# Patient Record
Sex: Female | Born: 1995 | Race: Black or African American | Hispanic: No | Marital: Single | Smoking: Never smoker
Health system: Southern US, Community
[De-identification: ages and names within clinical notes are randomized; demographics above are authoritative.]

## PROBLEM LIST (undated history)

## (undated) ENCOUNTER — Ambulatory Visit (HOSPITAL_COMMUNITY): Admission: EM | Discharge status: Absent without leave | Payer: Medicaid - Out of State

## (undated) DIAGNOSIS — F32A Depression, unspecified: Secondary | ICD-10-CM

## (undated) DIAGNOSIS — F329 Major depressive disorder, single episode, unspecified: Secondary | ICD-10-CM

## (undated) DIAGNOSIS — M069 Rheumatoid arthritis, unspecified: Secondary | ICD-10-CM

## (undated) DIAGNOSIS — K5090 Crohn's disease, unspecified, without complications: Secondary | ICD-10-CM

## (undated) DIAGNOSIS — F419 Anxiety disorder, unspecified: Secondary | ICD-10-CM

## (undated) HISTORY — DX: Major depressive disorder, single episode, unspecified: F32.9

## (undated) HISTORY — DX: Depression, unspecified: F32.A

## (undated) HISTORY — DX: Crohn's disease, unspecified, without complications: K50.90

## (undated) HISTORY — PX: CHOLECYSTECTOMY: SHX55

## (undated) HISTORY — DX: Anxiety disorder, unspecified: F41.9

## (undated) HISTORY — DX: Rheumatoid arthritis, unspecified: M06.9

---
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
## 2017-04-27 ENCOUNTER — Ambulatory Visit (HOSPITAL_COMMUNITY)
Admission: EM | Admit: 2017-04-27 | Discharge: 2017-04-27 | Disposition: A | Discharge status: Home / Self Care | Payer: Self-pay | Attending: Physician Assistant | Admitting: Physician Assistant

## 2017-04-27 ENCOUNTER — Encounter (HOSPITAL_COMMUNITY): Payer: Self-pay | Admitting: Family Medicine

## 2017-04-27 DIAGNOSIS — J0140 Acute pansinusitis, unspecified: Secondary | ICD-10-CM

## 2017-04-27 MED ORDER — METOCLOPRAMIDE HCL 5 MG/ML IJ SOLN
INTRAMUSCULAR | Status: AC
  Filled 2017-04-27: qty 2

## 2017-04-27 MED ORDER — METOCLOPRAMIDE HCL 5 MG/ML IJ SOLN
5.0000 mg | Freq: Once | INTRAMUSCULAR | Status: AC
  Administered 2017-04-27: 5 mg via INTRAMUSCULAR

## 2017-04-27 MED ORDER — DEXAMETHASONE SODIUM PHOSPHATE 10 MG/ML IJ SOLN
INTRAMUSCULAR | Status: AC
  Filled 2017-04-27: qty 1

## 2017-04-27 MED ORDER — BENZONATATE 100 MG PO CAPS: 100.0000 mg | ORAL_CAPSULE | Freq: Three times a day (TID) | ORAL | 0 refills | Status: DC

## 2017-04-27 MED ORDER — DEXAMETHASONE SODIUM PHOSPHATE 10 MG/ML IJ SOLN
10.0000 mg | Freq: Once | INTRAMUSCULAR | Status: AC
  Administered 2017-04-27: 10 mg via INTRAMUSCULAR

## 2017-04-27 MED ORDER — DOXYCYCLINE HYCLATE 100 MG PO CAPS: 100.0000 mg | ORAL_CAPSULE | Freq: Two times a day (BID) | ORAL | 0 refills | Status: DC

## 2017-04-27 MED ORDER — KETOROLAC TROMETHAMINE 60 MG/2ML IM SOLN
INTRAMUSCULAR | Status: AC
  Filled 2017-04-27: qty 2

## 2017-04-27 MED ORDER — FLUTICASONE PROPIONATE 50 MCG/ACT NA SUSP: 2.0000 | Freq: Every day | NASAL | 0 refills | Status: DC

## 2017-04-27 MED ORDER — IPRATROPIUM BROMIDE 0.06 % NA SOLN: 2.0000 | Freq: Four times a day (QID) | NASAL | 0 refills | Status: DC

## 2017-04-27 MED ORDER — KETOROLAC TROMETHAMINE 60 MG/2ML IM SOLN
60.0000 mg | Freq: Once | INTRAMUSCULAR | Status: AC
  Administered 2017-04-27: 60 mg via INTRAMUSCULAR

## 2017-04-27 NOTE — ED Notes (Signed)
Patient called mother during discussion of medications.  Patient is tearful, agitated.  Patient speaking to mother, this nurse spoke to mother about medicines.  Delay in administreation

## 2017-04-27 NOTE — ED Provider Notes (Signed)
[facility]    CSN: [id number] Arrival date & time: 04/27/17  1843     History   Chief Complaint Chief Complaint  Patient presents with  . Nasal Congestion  . Headache  . Cough    HPI Toni Gilbert is a 22 y.o. female.   22 year old female comes in for 2-week history of URI symptoms.  Has a productive cough, nasal congestion, rhinorrhea, headache, sore throat.  T-max 100.1.  Has had nausea without vomiting.  Denies diarrhea.  Has been taking OTC cold medication without relief.  Never smoker.     History reviewed. No pertinent past medical history.  There are no active problems to display for this patient.   History reviewed. No pertinent surgical history.  OB History   None      Home Medications    Prior to Admission medications   Medication Sig Start Date End Date Taking? Authorizing Provider  benzonatate (TESSALON) 100 MG capsule Take 1 capsule (100 mg total) by mouth every 8 (eight) hours. 04/27/17   Tasia Catchings, Delvina Mizzell V, PA-C  doxycycline (VIBRAMYCIN) 100 MG capsule Take 1 capsule (100 mg total) by mouth 2 (two) times daily. 04/27/17   Tasia Catchings, Kynnedi Zweig V, PA-C  fluticasone (FLONASE) 50 MCG/ACT nasal spray Place 2 sprays into both nostrils daily. 04/27/17   Tasia Catchings, Kayci Belleville V, PA-C  ipratropium (ATROVENT) 0.06 % nasal spray Place 2 sprays into both nostrils 4 (four) times daily. 04/27/17   Ok Edwards, PA-C    Family History History reviewed. No pertinent family history.  Social History Social History   Tobacco Use  . Smoking status: Never Smoker  Substance Use Topics  . Alcohol use: Not on file  . Drug use: Not on file     Allergies   Augmentin [amoxicillin-pot clavulanate]; Azithromycin; and Peanut-containing drug products   Review of Systems Review of Systems  Reason unable to perform ROS: See HPI as above.     Physical Exam Triage Vital Signs ED Triage Vitals  Enc Vitals Group     BP 04/27/17 2012 (!) 126/57     Pulse Rate 04/27/17 2012 91     Resp  04/27/17 2012 18     Temp 04/27/17 2012 98.3 F (36.8 C)     Temp src --      SpO2 04/27/17 2012 100 %     Weight --      Height --      Head Circumference --      Peak Flow --      Pain Score 04/27/17 2009 10     Pain Loc --      Pain Edu? --      Excl. in [place]? --    No data found.  Updated Vital Signs BP (!) 126/57   Pulse 91   Temp 98.3 F (36.8 C)   Resp 18   LMP 04/23/2017   SpO2 100%   Physical Exam  Constitutional: She is oriented to person, place, and time. She appears well-developed and well-nourished.  Non-toxic appearance. She does not appear ill. No distress.  Patient tearful on exam, states "everything hurts"  HENT:  Head: Normocephalic and atraumatic.  Right Ear: Tympanic membrane, external ear and ear canal normal. Tympanic membrane is not erythematous and not bulging.  Left Ear: Tympanic membrane, external ear and ear canal normal. Tympanic membrane is not erythematous and not bulging.  Nose: Mucosal edema present. Right sinus exhibits maxillary sinus tenderness and frontal sinus tenderness. Left sinus  exhibits maxillary sinus tenderness and frontal sinus tenderness.  Mouth/Throat: Uvula is midline, oropharynx is clear and moist and mucous membranes are normal.  Eyes: Pupils are equal, round, and reactive to light. EOM and lids are normal. Right conjunctiva is injected. Left conjunctiva is injected.  Conjunctival injection, patient tearful on exam.   Neck: Normal range of motion. Neck supple.  Cardiovascular: Normal rate, regular rhythm and normal heart sounds. Exam reveals no gallop and no friction rub.  No murmur heard. Pulmonary/Chest: Effort normal and breath sounds normal. She has no decreased breath sounds. She has no wheezes. She has no rhonchi. She has no rales.  Lymphadenopathy:    She has no cervical adenopathy.  Neurological: She is alert and oriented to person, place, and time.  Skin: Skin is warm and dry.  Psychiatric: She has a normal mood and  affect. Her behavior is normal. Judgment normal.     UC Treatments / Results  Labs (all labs ordered are listed, but only abnormal results are displayed) Labs Reviewed - No data to display  EKG None Radiology No results found.  Procedures Procedures (including critical care time)  Medications Ordered in UC Medications  ketorolac (TORADOL) injection 60 mg (has no administration in time range)  metoCLOPramide (REGLAN) injection 5 mg (has no administration in time range)  dexamethasone (DECADRON) injection 10 mg (has no administration in time range)     Initial Impression / Assessment and Plan / UC Course  I have reviewed the triage vital signs and the nursing notes.  Pertinent labs & imaging results that were available during my care of the patient were reviewed by me and considered in my medical decision making (see chart for details).    Doxycycline for sinusitis.  Given patient tearful, states pain "everywhere", with 2-week history of headache, Toradol, Reglan, Decadron injection in office today to help with the symptoms.  Other symptomatic treatment discussed.  Push fluids.  Return precautions given.  Final Clinical Impressions(s) / UC Diagnoses   Final diagnoses:  Acute non-recurrent pansinusitis    ED Discharge Orders        Ordered    doxycycline (VIBRAMYCIN) 100 MG capsule  2 times daily     04/27/17 2038    fluticasone (FLONASE) 50 MCG/ACT nasal spray  Daily     04/27/17 2038    ipratropium (ATROVENT) 0.06 % nasal spray  4 times daily     04/27/17 2038    benzonatate (TESSALON) 100 MG capsule  Every 8 hours     04/27/17 2038        Ok Edwards, PA-C 04/27/17 2042

## 2017-04-27 NOTE — ED Triage Notes (Signed)
Pt here for cough, congestion, headache and sore throat  X 1 week and half. Taking tylenol, advil and mucinex DM for relief.

## 2017-04-27 NOTE — Discharge Instructions (Signed)
Toradol, Reglan, Decadron injection in office today to help with your headache and body ache.  Start doxycycline as directed for sinus infection.  Tessalon for cough. Start flonase, atrovent nasal spray for nasal congestion/drainage. You can use over the counter nasal saline rinse such as neti pot for nasal congestion. Keep hydrated, your urine should be clear to pale yellow in color. Tylenol/motrin for fever and pain. Monitor for any worsening of symptoms, chest pain, shortness of breath, wheezing, swelling of the throat, follow up for reevaluation.   For sore throat try using a honey-based tea. Use 3 teaspoons of honey with juice squeezed from half lemon. Place shaved pieces of ginger into 1/2-1 cup of water and warm over stove top. Then mix the ingredients and repeat every 4 hours as needed.

## 2017-05-01 ENCOUNTER — Emergency Department (HOSPITAL_BASED_OUTPATIENT_CLINIC_OR_DEPARTMENT_OTHER)
Admission: EM | Admit: 2017-05-01 | Discharge: 2017-05-01 | Disposition: A | Discharge status: Home / Self Care | Payer: Medicaid - Out of State | Attending: Emergency Medicine | Admitting: Emergency Medicine

## 2017-05-01 ENCOUNTER — Other Ambulatory Visit: Payer: Self-pay

## 2017-05-01 ENCOUNTER — Encounter (HOSPITAL_BASED_OUTPATIENT_CLINIC_OR_DEPARTMENT_OTHER): Payer: Self-pay | Admitting: *Deleted

## 2017-05-01 DIAGNOSIS — R519 Headache, unspecified: Secondary | ICD-10-CM

## 2017-05-01 DIAGNOSIS — Z91010 Allergy to peanuts: Secondary | ICD-10-CM | POA: Diagnosis not present

## 2017-05-01 DIAGNOSIS — R51 Headache: Secondary | ICD-10-CM | POA: Insufficient documentation

## 2017-05-01 MED ORDER — NAPROXEN 500 MG PO TABS: 500.0000 mg | ORAL_TABLET | Freq: Two times a day (BID) | ORAL | 0 refills | Status: DC

## 2017-05-01 MED ORDER — KETOROLAC TROMETHAMINE 15 MG/ML IJ SOLN
15.0000 mg | Freq: Once | INTRAMUSCULAR | Status: AC
  Administered 2017-05-01: 15 mg via INTRAVENOUS
  Filled 2017-05-01: qty 1

## 2017-05-01 MED ORDER — CETIRIZINE HCL 10 MG PO TABS: 10.0000 mg | ORAL_TABLET | Freq: Every day | ORAL | 0 refills | Status: DC

## 2017-05-01 MED ORDER — METHOCARBAMOL 500 MG PO TABS: 500.0000 mg | ORAL_TABLET | Freq: Three times a day (TID) | ORAL | 0 refills | Status: DC | PRN

## 2017-05-01 MED ORDER — SODIUM CHLORIDE 0.9 % IV BOLUS
1000.0000 mL | Freq: Once | INTRAVENOUS | Status: AC
  Administered 2017-05-01: 1000 mL via INTRAVENOUS

## 2017-05-01 MED ORDER — PROCHLORPERAZINE EDISYLATE 5 MG/ML IJ SOLN
10.0000 mg | Freq: Once | INTRAMUSCULAR | Status: AC
  Administered 2017-05-01: 10 mg via INTRAVENOUS
  Filled 2017-05-01: qty 2

## 2017-05-01 MED ORDER — SODIUM CHLORIDE 0.9 % IV SOLN
INTRAVENOUS | Status: DC
  Administered 2017-05-01: 125 mL/h via INTRAVENOUS

## 2017-05-01 MED ORDER — DEXAMETHASONE SODIUM PHOSPHATE 10 MG/ML IJ SOLN
10.0000 mg | Freq: Once | INTRAMUSCULAR | Status: AC
  Administered 2017-05-01: 10 mg via INTRAVENOUS
  Filled 2017-05-01: qty 1

## 2017-05-01 NOTE — ED Triage Notes (Signed)
Cough, earache, body aches, headache for a week. She was started on antibiotics for a sinus infection a week ago with no improvement of symptoms.

## 2017-05-01 NOTE — ED Provider Notes (Signed)
[facility]PART[facility] Provider Note   CSN: [id number] Arrival date & time: 05/01/17  1849     History   Chief Complaint Chief Complaint  Patient presents with  . URI    HPI Toni Gilbert is a 22 y.o. female who presents to the ED with complaint of worsened headache and persistent URI sxs today. Patient states she has not felt well for about 2 weeks now. States she has been having congestion, rhinorrhea, sinus pressure, ear pain, sore throat, generalized body aches, chills, nausea, and headaches. Reports subjective fevers with temp max of 100.1.  She was seen at urgent care 03/31 (5 days prior) and was diagnosed with sinusitis and started on Doxycycline with additional prescriptions including Flonase, Atrovent nasal spray, and Tessalon. States she has been taking her medications as prescribed without significant improvement. States that today her headache gradually worsened. The headache is located to diffuse head and into the diffuse neck. Rates it a 10/10 in severity. States she has had associated intermittent double vision and feeling off balance- she states this is the same as previous migraines that have improved with migraine cocktails. Denies dizziness, numbness, weakness, chest pain, or dyspnea.  LMP ended 5 days prior, patient is not currently sexually active.    HPI  History reviewed. No pertinent past medical history.  There are no active problems to display for this patient.   History reviewed. No pertinent surgical history.   OB History   None      Home Medications    Prior to Admission medications   Medication Sig Start Date End Date Taking? Authorizing Provider  benzonatate (TESSALON) 100 MG capsule Take 1 capsule (100 mg total) by mouth every 8 (eight) hours. 04/27/17   Tasia Catchings, Amy V, PA-C  doxycycline (VIBRAMYCIN) 100 MG capsule Take 1 capsule (100 mg total) by mouth 2 (two) times daily. 04/27/17   Tasia Catchings, Amy V, PA-C  fluticasone (FLONASE) 50  MCG/ACT nasal spray Place 2 sprays into both nostrils daily. 04/27/17   Tasia Catchings, Amy V, PA-C  ipratropium (ATROVENT) 0.06 % nasal spray Place 2 sprays into both nostrils 4 (four) times daily. 04/27/17   Ok Edwards, PA-C    Family History History reviewed. No pertinent family history.  Social History Social History   Tobacco Use  . Smoking status: Never Smoker  . Smokeless tobacco: Never Used  Substance Use Topics  . Alcohol use: Never    Frequency: Never  . Drug use: Never     Allergies   Augmentin [amoxicillin-pot clavulanate]; Azithromycin; and Peanut-containing drug products   Review of Systems Review of Systems  Constitutional: Positive for chills.  HENT: Positive for congestion, ear pain, sinus pressure, sinus pain and sore throat.   Eyes: Positive for visual disturbance (intermittent double vision).  Respiratory: Positive for cough. Negative for shortness of breath.   Cardiovascular: Negative for chest pain, palpitations and leg swelling.  Gastrointestinal: Positive for nausea. Negative for abdominal pain, diarrhea and vomiting.  Genitourinary: Negative for vaginal bleeding and vaginal discharge.  Neurological: Positive for headaches. Negative for dizziness, syncope, weakness and numbness.  All other systems reviewed and are negative.    Physical Exam Updated Vital Signs Ht 5' 3"  (1.6 m)   Wt 55.3 kg (122 lb)   LMP 04/23/2017   BMI 21.61 kg/m   Physical Exam  Constitutional: She appears well-developed and well-nourished.  Non-toxic appearance. No distress.  HENT:  Head: Normocephalic and atraumatic.  Right Ear: No mastoid tenderness. Tympanic membrane  is not perforated, not erythematous, not retracted and not bulging.  Left Ear: No mastoid tenderness. Tympanic membrane is not perforated, not erythematous, not retracted and not bulging.  Nose: Mucosal edema and rhinorrhea present. Right sinus exhibits maxillary sinus tenderness and frontal sinus tenderness. Left sinus  exhibits maxillary sinus tenderness and frontal sinus tenderness.  Mouth/Throat: Uvula is midline and oropharynx is clear and moist. No oropharyngeal exudate or posterior oropharyngeal erythema.  Eyes: Pupils are equal, round, and reactive to light. Conjunctivae and EOM are normal. Right eye exhibits no discharge. Left eye exhibits no discharge.  Neck: Normal range of motion. Neck supple. Spinous process tenderness (diffuse, non focal) and muscular tenderness (bilateral) present. No neck rigidity.  Cardiovascular: Normal rate and regular rhythm.  No murmur heard. Pulmonary/Chest: Effort normal and breath sounds normal. No respiratory distress. She has no wheezes. She has no rhonchi. She has no rales.  Abdominal: Soft. She exhibits no distension. There is no tenderness.  Musculoskeletal:  Back: No midline tenderness.   Lymphadenopathy:    She has cervical adenopathy (mild anterior).  Neurological: She is alert.  Alert. Clear speech. No facial droop. CNIII-XII are intact. Bilateral upper and lower extremities' sensation grossly intact. 5/5 grip strength bilaterally. 5/5 plantar and dorsi flexion bilaterally. Normal finger to nose bilaterally. Negative pronator drift. Gait steady.   Skin: Skin is warm and dry. No rash noted.  Psychiatric: She has a normal mood and affect. Her behavior is normal.  Nursing note and vitals reviewed.   ED Treatments / Results  Labs (all labs ordered are listed, but only abnormal results are displayed) Labs Reviewed - No data to display  EKG None  Radiology No results found.  Procedures Procedures (including critical care time)  Medications Ordered in ED Medications  sodium chloride 0.9 % bolus 1,000 mL (0 mLs Intravenous Stopped 05/01/17 2108)    And  0.9 %  sodium chloride infusion ( Intravenous Stopped 05/01/17 2108)  ketorolac (TORADOL) 15 MG/ML injection 15 mg (15 mg Intravenous Given 05/01/17 1929)  prochlorperazine (COMPAZINE) injection 10 mg (10 mg  Intravenous Given 05/01/17 1929)  dexamethasone (DECADRON) injection 10 mg (10 mg Intravenous Given 05/01/17 1929)     Initial Impression / Assessment and Plan / ED Course  I have reviewed the triage vital signs and the nursing notes.  Pertinent labs & imaging results that were available during my care of the patient were reviewed by me and considered in my medical decision making (see chart for details).   Patient presents with persistent URI sxs and worsened headache today. Patient is nontoxic appearing, in no apparent distress, vitals without significant abnormality. Regarding URI sxs- patient currently on doxycyline for tx of sinusitis, on day 5 of abx, do not feel the need to change abx for this. Cent[facility] Criteria 1 for anterior cervical lymphadenopathy, doubt strep pharyngitis. Lungs are CTA, doubt pneumonia, additionally patient on doxycycline with good coverage for a possible pneumonia which is reassuring. No wheezing on lung exam. In regards to patient's headache- she has a hx of similar headaches, gradual onset with steady progression in severity- non concerning for [facility], ICH, ischemic CVA, acute glaucoma, giant cell arteritis, mass, or meningitis. Patient treated with toradol, compazine, decadron, and fluids in the ED with improvement. Will DC home with prescription for naproxen, robaxin, and zyrtec, instructed continuation of urgent care tx regimen. Instructed no driving/operating heavy machinery when taking robaxin. I discussed treatment plan, need for PCP follow-up, and return precautions with the patient. Provided opportunity  for questions, patient confirmed understanding and is in agreement with plan.   Findings and plan of care discussed with supervising physician Dr. Maryan Rued who is in agreement with plan.    Final Clinical Impressions(s) / ED Diagnoses   Final diagnoses:  Acute nonintractable headache, unspecified headache type    ED Discharge Orders        Ordered    naproxen  (NAPROSYN) 500 MG tablet  2 times daily     05/01/17 2122    cetirizine (ZYRTEC ALLERGY) 10 MG tablet  Daily     05/01/17 2122    methocarbamol (ROBAXIN) 500 MG tablet  Every 8 hours PRN     05/01/17 2122       Amaryllis Dyke, PA-C 05/02/17 0005    Blanchie Dessert, MD 05/03/17 [phone]

## 2017-05-01 NOTE — Discharge Instructions (Signed)
You were seen in the emergency department today for your headache and continued upper respiratory type symptoms. We have given you three prescriptions today:  - Naproxen is a nonsteroidal anti-inflammatory medication that will help with pain and swelling. Be sure to take this medication as prescribed with food, 1 pill every 12 hours,  It should be taken with food, as it can cause stomach upset, and more seriously, stomach bleeding. Do not take other nonsteroidal anti-inflammatory medications with this such as Advil, Motrin, or Aleve.  - Robaxin is the muscle relaxer I have prescribed, this is meant to help with muscle tightness. Be aware that this medication may make you drowsy therefore the first time you take this it should be at a time you are in an environment where you can rest. Do not drive or operate heavy machinery when taking this medication. This should help with the tension in your neck muscles - Zyrtec- this is an allergy medication, take it daily.   We have prescribed you new medication(s) today. Discuss the medications prescribed today with your pharmacist as they can have adverse effects and interactions with your other medicines including over the counter and prescribed medications. Seek medical evaluation if you start to experience new or abnormal symptoms after taking one of these medicines, seek care immediately if you start to experience difficulty breathing, feeling of your throat closing, facial swelling, or rash as these could be indications of a more serious allergic reaction   Continue to use the medications prescribed at urgent care. Complete the course of the antibiotic, doxycycline you were prescribed.   Follow up with your primary care doctor in 1 week for re-evaluation. If you do not have a primary care doctor call the phone number provided in your discharge instructions that is circled to set up primary care. Return to the ER for any new or worsening symptoms or any other  concerns.

## 2017-11-27 ENCOUNTER — Ambulatory Visit (INDEPENDENT_AMBULATORY_CARE_PROVIDER_SITE_OTHER): Payer: Self-pay | Admitting: Internal Medicine

## 2017-11-27 ENCOUNTER — Encounter: Payer: Self-pay | Admitting: Internal Medicine

## 2017-11-27 VITALS — BP 102/62 | HR 113 | Temp 98.4°F | Ht 63.0 in | Wt 132.4 lb

## 2017-11-27 DIAGNOSIS — M069 Rheumatoid arthritis, unspecified: Secondary | ICD-10-CM

## 2017-11-27 DIAGNOSIS — F419 Anxiety disorder, unspecified: Secondary | ICD-10-CM

## 2017-11-27 DIAGNOSIS — M25571 Pain in right ankle and joints of right foot: Secondary | ICD-10-CM

## 2017-11-27 DIAGNOSIS — F329 Major depressive disorder, single episode, unspecified: Secondary | ICD-10-CM

## 2017-11-27 DIAGNOSIS — F32A Depression, unspecified: Secondary | ICD-10-CM

## 2017-11-27 DIAGNOSIS — J45909 Unspecified asthma, uncomplicated: Secondary | ICD-10-CM | POA: Insufficient documentation

## 2017-11-27 DIAGNOSIS — M25572 Pain in left ankle and joints of left foot: Secondary | ICD-10-CM

## 2017-11-27 DIAGNOSIS — J309 Allergic rhinitis, unspecified: Secondary | ICD-10-CM

## 2017-11-27 DIAGNOSIS — R0602 Shortness of breath: Secondary | ICD-10-CM

## 2017-11-27 DIAGNOSIS — G43909 Migraine, unspecified, not intractable, without status migrainosus: Secondary | ICD-10-CM

## 2017-11-27 DIAGNOSIS — R5383 Other fatigue: Secondary | ICD-10-CM

## 2017-11-27 DIAGNOSIS — R609 Edema, unspecified: Secondary | ICD-10-CM

## 2017-11-27 MED ORDER — CETIRIZINE HCL 10 MG PO TABS: 10.0000 mg | ORAL_TABLET | Freq: Every day | ORAL | 0 refills | Status: AC

## 2017-11-27 MED ORDER — FLUTICASONE PROPIONATE 50 MCG/ACT NA SUSP: 2.0000 | Freq: Every day | NASAL | 0 refills | Status: AC

## 2017-11-27 MED ORDER — HYDROXYZINE HCL 25 MG PO TABS: ORAL_TABLET | ORAL | 0 refills | Status: DC

## 2017-11-27 MED ORDER — CITALOPRAM HYDROBROMIDE 10 MG PO TABS: ORAL_TABLET | ORAL | 0 refills | Status: AC

## 2017-11-27 MED ORDER — GABAPENTIN 300 MG PO CAPS: 300.0000 mg | ORAL_CAPSULE | Freq: Two times a day (BID) | ORAL | 0 refills | Status: DC

## 2017-11-27 MED ORDER — BUPROPION HCL ER (XL) 150 MG PO TB24: ORAL_TABLET | ORAL | 0 refills | Status: AC

## 2017-11-27 MED ORDER — RIZATRIPTAN BENZOATE 10 MG PO TABS: 10.0000 mg | ORAL_TABLET | ORAL | 0 refills | Status: AC | PRN

## 2017-11-27 NOTE — Patient Instructions (Addendum)
Sign up for Mychart so we can communicate about your labs.  Stop taking any Ibuprofen kind of medication. It is OK to take Tylenol.    Edema Edema is when you have too much fluid in your body or under your skin. Edema may make your legs, feet, and ankles swell up. Swelling is also common in looser tissues, like around your eyes. This is a common condition. It gets more common as you get older. There are many possible causes of edema. Eating too much salt (sodium) and being on your feet or sitting for a long time can cause edema in your legs, feet, and ankles. Hot weather may make edema worse. Edema is usually painless. Your skin may look swollen or shiny. Follow these instructions at home:  Keep the swollen body part raised (elevated) above the level of your heart when you are sitting or lying down.  Do not sit still or stand for a long time.  Do not wear tight clothes. Do not wear garters on your upper legs.  Exercise your legs. This can help the swelling go down.  Wear elastic bandages or support stockings as told by your doctor.  Eat a low-salt (low-sodium) diet to reduce fluid as told by your doctor.  Depending on the cause of your swelling, you may need to limit how much fluid you drink (fluid restriction).  Take over-the-counter and prescription medicines only as told by your doctor. Contact a doctor if:  Treatment is not working.  You have heart, liver, or kidney disease and have symptoms of edema.  You have sudden and unexplained weight gain. Get help right away if:  You have shortness of breath or chest pain.  You cannot breathe when you lie down.  You have pain, redness, or warmth in the swollen areas.  You have heart, liver, or kidney disease and get edema all of a sudden.  You have a fever and your symptoms get worse all of a sudden. Summary  Edema is when you have too much fluid in your body or under your skin.  Edema may make your legs, feet, and ankles  swell up. Swelling is also common in looser tissues, like around your eyes.  Raise (elevate) the swollen body part above the level of your heart when you are sitting or lying down.  Follow your doctor's instructions about diet and how much fluid you can drink (fluid restriction). This information is not intended to replace advice given to you by your health care provider. Make sure you discuss any questions you have with your health care provider. Document Released: 07/03/2007 Document Revised: 02/02/2016 Document Reviewed: 02/02/2016 Elsevier Interactive Patient Education  2017 [employer].

## 2017-11-27 NOTE — Progress Notes (Signed)
Subjective:     Patient ID: Toni Gilbert , female    DOB: 1995/10/13 , 22 y.o.   MRN: [id number]   Chief Complaint  Patient presents with  . New Patient (Initial Visit)  . Edema    C/O feet swelling     HPI Pt is here to be established with [place]. She moved from [place] with her mother and twin sisters and has not found a PCP she likes. Her mother suggested to come establish with me. Does not have psychiatry apt for a month and just found out she lost her insurance.  Has been having feet and ankle swelling x 3 weeks. Has elevated it and been taking NSAID'S AND IS NOT BETTER. Painful to walk, cant fit in her shoes. Has been taking Naprosyn 500 mg bid x 3 weeks, other NSAid did not help. She works on her feet, but event when she wakes in the am, she still has swelling. Denies long car rides or flights.  Noticed feeling SOB with mild activity yesterday. Has been fatigued.     Past Medical History:  Diagnosis Date  . Anxiety   . Crohn disease ([place])   . Depression   . Rheumatoid arthritis ([place])      Family History  Problem Relation Age of Onset  . Cancer Maternal Grandmother   . Cancer Paternal Grandfather      Current Outpatient Medications:  .  buPROPion (WELLBUTRIN XL) 150 MG 24 hr tablet, TK 1 T PO QD, Disp: , Rfl: 0 .  cetirizine (ZYRTEC ALLERGY) 10 MG tablet, Take 1 tablet (10 mg total) by mouth daily., Disp: 30 tablet, Rfl: 0 .  citalopram (CELEXA) 10 MG tablet, TK 1 T PO QD, Disp: , Rfl: 0 .  clonazePAM (KLONOPIN) 1 MG tablet, Take 1 mg by mouth 2 (two) times daily., Disp: , Rfl:  .  fluticasone (FLONASE) 50 MCG/ACT nasal spray, Place 2 sprays into both nostrils daily., Disp: 1 g, Rfl: 0 .  gabapentin (NEURONTIN) 100 MG capsule, TK 1 C PO BID, Disp: , Rfl: 5 .  gabapentin (NEURONTIN) 300 MG capsule, , Disp: , Rfl: 3 .  hydrOXYzine (ATARAX/VISTARIL) 25 MG tablet, TK 1 T PO BID, Disp: , Rfl: 0 .  naproxen (NAPROSYN) 500 MG tablet, Take 1 tablet (500 mg total) by mouth 2 (two)  times daily., Disp: 30 tablet, Rfl: 0 .  rizatriptan (MAXALT) 10 MG tablet, Take 10 mg by mouth as needed for migraine. May repeat in 2 hours if needed, Disp: , Rfl:    Allergies  Allergen Reactions  . Augmentin [Amoxicillin-Pot Clavulanate]   . Azithromycin   . Peanut-Containing Drug Products      Review of Systems  Constitutional: Positive for fatigue. Negative for chills, diaphoresis and fever.  HENT: Positive for postnasal drip and rhinorrhea.   Respiratory: Positive for shortness of breath. Negative for cough, chest tightness and wheezing.   Cardiovascular: Positive for leg swelling. Negative for chest pain and palpitations.  Gastrointestinal: Negative for abdominal distention, abdominal pain, diarrhea, nausea and vomiting.  Endocrine: Negative for polydipsia and polyphagia.  Genitourinary: Negative for dysuria, frequency and urgency.  Musculoskeletal: Positive for arthralgias and joint swelling.  Skin: Negative for color change, pallor, rash and wound.  Neurological: Negative for dizziness and headaches.  Psychiatric/Behavioral:       Anxiety and depression stable on current meds     Today's Vitals   11/27/17 1544  BP: 102/62  Pulse: (!) 113  Temp: 98.4 F (36.9  C)  TempSrc: Oral  SpO2: 98%  Weight: 132 lb 6.4 oz (60.1 kg)  Height: 5' 3"  (1.6 m)   Body mass index is 23.45 kg/m.   Objective:  Physical Exam  Constitutional: She is oriented to person, place, and time. She appears well-developed and well-nourished. No distress.  HENT:  Head: Normocephalic.  Right Ear: External ear normal.  Left Ear: External ear normal.  Nose: Nose normal.  Mouth/Throat: No oropharyngeal exudate.  Eyes: EOM are normal. Right eye exhibits no discharge. Left eye exhibits no discharge. No scleral icterus.  Conjunctivas are pale  Neck: Neck supple. No tracheal deviation present. No thyromegaly present.  Cardiovascular: Normal rate, regular rhythm and intact distal pulses.  No murmur  heard. Rate is 90  Pulmonary/Chest: Effort normal and breath sounds normal. No respiratory distress. She has no rales.  No egophony  Abdominal: Soft. Bowel sounds are normal. She exhibits no distension and no mass. There is no tenderness. There is no rebound and no guarding. No hernia.  Has well healed scars from choly  Musculoskeletal: Normal range of motion. She exhibits edema and tenderness.  Has moderate swelling of both feet and ankles, but they are not pitting. She has diffuse tenderness on both feet dorsally, R sole and arch area, and R lateral ankle. Her entire lower legs are tender to palpation, but there is not edema, calf asymmetry or cords.   Lymphadenopathy:    She has no cervical adenopathy.  Neurological: She is alert and oriented to person, place, and time. No sensory deficit. She exhibits normal muscle tone. Coordination normal.  Skin: Skin is warm and dry. Capillary refill takes less than 2 seconds. No rash noted. She is not diaphoretic. No erythema.  Psychiatric: She has a normal mood and affect. Her behavior is normal. Judgment and thought content normal.  Nursing note and vitals reviewed.   Assessment And Plan:    1. Edema, unspecified type- acute - POCT Urinalysis Dipstick (81002) - CMP14 + Anion Gap - Brain natriuretic peptide - CBC no Diff  2. SOB (shortness of breath)- intermittent acute.  - Brain natriuretic peptide - DG Chest 2 View; Future  3. Fatigue, unspecified type - CBC no Diff - TSH - T4, Free - T3, free  4. Rheumatoid arthritis involving ankle, unspecified laterality, unspecified rheumatoid factor presence ([place])- chronic- sent to rhumatologist  5. Acute bilateral ankle pain- and swelling. I ordered CMP. She left before we could get a UA.   6. Pain in joints of both feet- acute- labs ordered as noted.   7- Depression- stable on current meds 8- Anxiety- stable on current meds.  I refilled her med's and explained to her I am not comfortable  filling the Klonopin, and her psychiatrist needs to continue this.   I will call her next week ones the results are in. Needs to stop taking any NSAIDs    Thecla Forgione RODRIGUEZ-SOUTHWORTH, PA-C

## 2017-11-28 LAB — CBC
HEMOGLOBIN: 12.5 g/dL (ref 11.1–15.9)
Hematocrit: 38.1 % (ref 34.0–46.6)
MCH: 29.3 pg (ref 26.6–33.0)
MCHC: 32.8 g/dL (ref 31.5–35.7)
MCV: 89 fL (ref 79–97)
PLATELETS: 515 10*3/uL — AB (ref 150–450)
RBC: 4.26 x10E6/uL (ref 3.77–5.28)
RDW: 12.3 % (ref 12.3–15.4)
WBC: 6.4 10*3/uL (ref 3.4–10.8)

## 2017-11-28 LAB — CMP14 + ANION GAP
ALT: 106 IU/L — ABNORMAL HIGH (ref 0–32)
ANION GAP: 14 mmol/L (ref 10.0–18.0)
AST: 54 IU/L — AB (ref 0–40)
Albumin/Globulin Ratio: 2 (ref 1.2–2.2)
Albumin: 4.3 g/dL (ref 3.5–5.5)
Alkaline Phosphatase: 64 IU/L (ref 39–117)
BUN/Creatinine Ratio: 18 (ref 9–23)
BUN: 9 mg/dL (ref 6–20)
Bilirubin Total: <0.2 mg/dL (ref 0.0–1.2)
CALCIUM: 9.4 mg/dL (ref 8.7–10.2)
CO2: 22 mmol/L (ref 20–29)
CREATININE: 0.49 mg/dL — AB (ref 0.57–1.00)
Chloride: 103 mmol/L (ref 96–106)
GFR calc Af Amer: 159 mL/min/{1.73_m2} (ref >59)
GFR, EST NON AFRICAN AMERICAN: 138 mL/min/{1.73_m2} (ref >59)
GLOBULIN, TOTAL: 2.1 g/dL (ref 1.5–4.5)
GLUCOSE: 84 mg/dL (ref 65–99)
Potassium: 4.5 mmol/L (ref 3.5–5.2)
SODIUM: 139 mmol/L (ref 134–144)
Total Protein: 6.4 g/dL (ref 6.0–8.5)

## 2017-11-28 LAB — T4, FREE: Free T4: 0.94 ng/dL (ref 0.82–1.77)

## 2017-11-28 LAB — PRO B NATRIURETIC PEPTIDE: NT-PRO BNP: 32 pg/mL (ref 0–130)

## 2017-11-28 LAB — TSH: TSH: 1.02 u[IU]/mL (ref 0.450–4.500)

## 2017-11-28 LAB — T3, FREE: T3 FREE: 3.1 pg/mL (ref 2.0–4.4)

## 2017-12-01 ENCOUNTER — Encounter: Payer: Self-pay | Admitting: Internal Medicine

## 2017-12-02 ENCOUNTER — Ambulatory Visit
Admission: RE | Admit: 2017-12-02 | Discharge: 2017-12-02 | Disposition: A | Discharge status: Home / Self Care | Payer: Medicaid - Out of State | Source: Ambulatory Visit | Attending: Internal Medicine | Admitting: Internal Medicine

## 2017-12-02 ENCOUNTER — Other Ambulatory Visit: Payer: Self-pay | Admitting: Internal Medicine

## 2017-12-02 DIAGNOSIS — M25571 Pain in right ankle and joints of right foot: Secondary | ICD-10-CM

## 2017-12-02 DIAGNOSIS — R0602 Shortness of breath: Secondary | ICD-10-CM

## 2017-12-02 DIAGNOSIS — R609 Edema, unspecified: Secondary | ICD-10-CM

## 2017-12-02 DIAGNOSIS — M25572 Pain in left ankle and joints of left foot: Principal | ICD-10-CM

## 2017-12-02 DIAGNOSIS — R945 Abnormal results of liver function studies: Secondary | ICD-10-CM

## 2017-12-04 ENCOUNTER — Other Ambulatory Visit: Payer: Self-pay | Admitting: Internal Medicine

## 2017-12-04 DIAGNOSIS — M069 Rheumatoid arthritis, unspecified: Secondary | ICD-10-CM

## 2017-12-04 DIAGNOSIS — M25571 Pain in right ankle and joints of right foot: Secondary | ICD-10-CM

## 2017-12-04 DIAGNOSIS — M25572 Pain in left ankle and joints of left foot: Secondary | ICD-10-CM

## 2017-12-04 NOTE — Progress Notes (Signed)
New referral placed since I received this message from the one she was sent to:  Carole Binning, LPN  [facility], Sandrea Matte        We appreciate your referral. Each referral is reviewed by our providers to ensure that the patient will receive the best medical care possible. Upon review of this referral Dr. Estanislado Pandy has declined it.

## 2017-12-11 ENCOUNTER — Other Ambulatory Visit: Payer: Self-pay

## 2017-12-11 DIAGNOSIS — R0602 Shortness of breath: Secondary | ICD-10-CM

## 2017-12-11 DIAGNOSIS — R609 Edema, unspecified: Secondary | ICD-10-CM

## 2017-12-11 DIAGNOSIS — M25571 Pain in right ankle and joints of right foot: Secondary | ICD-10-CM

## 2017-12-11 DIAGNOSIS — R945 Abnormal results of liver function studies: Secondary | ICD-10-CM

## 2017-12-11 DIAGNOSIS — M25572 Pain in left ankle and joints of left foot: Secondary | ICD-10-CM

## 2017-12-13 LAB — HEPATITIS PANEL, ACUTE
HEP B C IGM: NEGATIVE
Hep A IgM: NEGATIVE
Hep C Virus Ab: <0.1 s/co ratio (ref 0.0–0.9)
Hepatitis B Surface Ag: NEGATIVE

## 2017-12-13 LAB — URIC ACID: Uric Acid: 2.5 mg/dL (ref 2.5–7.1)

## 2017-12-13 LAB — SEDIMENTATION RATE: Sed Rate: 2 mm/hr (ref 0–32)

## 2017-12-13 LAB — FANA STAINING PATTERNS

## 2017-12-13 LAB — D-DIMER, QUANTITATIVE (NOT AT ARMC): D-DIMER: 0.51 mg{FEU}/L — AB (ref 0.00–0.49)

## 2017-12-13 LAB — ANTINUCLEAR ANTIBODIES, IFA: ANA Titer 1: POSITIVE — AB

## 2017-12-16 ENCOUNTER — Encounter: Payer: Self-pay | Admitting: Internal Medicine

## 2017-12-23 ENCOUNTER — Telehealth: Payer: Self-pay

## 2017-12-23 ENCOUNTER — Encounter: Payer: Self-pay | Admitting: Internal Medicine

## 2017-12-23 DIAGNOSIS — M05772 Rheumatoid arthritis with rheumatoid factor of left ankle and foot without organ or systems involvement: Secondary | ICD-10-CM

## 2017-12-23 DIAGNOSIS — Z23 Encounter for immunization: Secondary | ICD-10-CM

## 2017-12-23 DIAGNOSIS — M05771 Rheumatoid arthritis with rheumatoid factor of right ankle and foot without organ or systems involvement: Secondary | ICD-10-CM

## 2017-12-23 MED ORDER — PREDNISONE 10 MG (21) PO TBPK: ORAL_TABLET | ORAL | 0 refills | Status: DC

## 2017-12-23 MED ORDER — TRAMADOL-ACETAMINOPHEN 37.5-325 MG PO TABS: 1.0000 | ORAL_TABLET | Freq: Four times a day (QID) | ORAL | 0 refills | Status: DC | PRN

## 2017-12-23 NOTE — Telephone Encounter (Signed)
Patient lvm stating her swelling on feet is progressively getting worse, she would like a call from you to discuss this CB [phone]

## 2017-12-23 NOTE — Telephone Encounter (Signed)
I called pt back but no answer. I left a message and sent her a Mychart note. I checked second referral for rheumatology, but cant tell if it is in process or not.

## 2018-01-19 ENCOUNTER — Other Ambulatory Visit: Payer: Self-pay

## 2018-01-19 ENCOUNTER — Encounter (HOSPITAL_COMMUNITY): Payer: Self-pay | Admitting: Emergency Medicine

## 2018-01-19 ENCOUNTER — Ambulatory Visit (HOSPITAL_COMMUNITY)
Admission: EM | Admit: 2018-01-19 | Discharge: 2018-01-19 | Disposition: A | Discharge status: Home / Self Care | Payer: BLUE CROSS/BLUE SHIELD | Attending: Internal Medicine | Admitting: Internal Medicine

## 2018-01-19 DIAGNOSIS — J0110 Acute frontal sinusitis, unspecified: Secondary | ICD-10-CM | POA: Diagnosis not present

## 2018-01-19 DIAGNOSIS — R059 Cough, unspecified: Secondary | ICD-10-CM

## 2018-01-19 DIAGNOSIS — R05 Cough: Secondary | ICD-10-CM | POA: Insufficient documentation

## 2018-01-19 MED ORDER — DOXYCYCLINE HYCLATE 100 MG PO CAPS: 100.0000 mg | ORAL_CAPSULE | Freq: Two times a day (BID) | ORAL | 0 refills | Status: AC

## 2018-01-19 MED ORDER — BENZONATATE 100 MG PO CAPS: 100.0000 mg | ORAL_CAPSULE | Freq: Three times a day (TID) | ORAL | 0 refills | Status: AC

## 2018-01-19 MED ORDER — HYDROCODONE-HOMATROPINE 5-1.5 MG/5ML PO SYRP: 5.0000 mL | ORAL_SOLUTION | Freq: Four times a day (QID) | ORAL | 0 refills | Status: AC | PRN

## 2018-01-19 NOTE — ED Triage Notes (Signed)
Not feeling well for 2 weeks.  Initially thought to be allergies.  Now ears hurting, particularly the left ear is hurting, sore throat coughing up discolored phlegm and blowing discolored secretions: described as green and yellow.  Patient has a headache

## 2018-01-19 NOTE — Discharge Instructions (Addendum)
We will go ahead and treat you for a sinus infection today with doxycycline Continue the Flonase and Zyrtec daily I am also giving you something to help with the cough Tessalon pearls as needed for cough, these are non drowsy You can take the hycodan at nighttime for cough.  Follow up as needed for continued or worsening symptoms

## 2018-01-23 NOTE — ED Provider Notes (Signed)
[facility]    CSN: [id number] Arrival date & time: 01/19/18  1648     History   Chief Complaint Chief Complaint  Patient presents with  . URI    HPI Toni Gilbert is a 22 y.o. female.   Pt is a 22 year old female with PMH of asthma that presents with 2 weeks of nasal congestion,cough,  sinus pressure, bilateral ear pressure, with increased thick mucous. Mucous is brown, yellow in color. The symptoms have been worsening. She has been using Flonase and antihistamine with no relief. She has also had mild sinus headache and hacking cough.  Using ibuprofen. No fevers, body aches, chills.   ROS per HPI    URI    Past Medical History:  Diagnosis Date  . Anxiety   . Crohn disease ([place])   . Depression   . Rheumatoid arthritis [facility])     Patient Active Problem List   Diagnosis Date Noted  . Depression 11/27/2017  . Anxiety 11/27/2017  . Rheumatoid arthritis ([place]) 11/27/2017  . Migraines 11/27/2017  . Asthma     Past Surgical History:  Procedure Laterality Date  . CHOLECYSTECTOMY    . LAPAROSCOPY  2014    OB History   No obstetric history on file.      Home Medications    Prior to Admission medications   Medication Sig Start Date End Date Taking? Authorizing Provider  acetaminophen (TYLENOL) 325 MG tablet Take 650 mg by mouth every 6 (six) hours as needed.   Yes [provider]  ALBUTEROL IN Inhale into the lungs.   Yes [provider]  ibuprofen (ADVIL,MOTRIN) 200 MG tablet Take 200 mg by mouth every 6 (six) hours as needed.   Yes [provider]  norgestimate-ethinyl estradiol (ORTHO-CYCLEN,SPRINTEC,PREVIFEM) 0.25-35 MG-MCG tablet Take 1 tablet by mouth daily.   Yes [provider]  benzonatate (TESSALON) 100 MG capsule Take 1 capsule (100 mg total) by mouth every 8 (eight) hours. 01/19/18   Orvan July, NP  buPROPion (WELLBUTRIN XL) 150 MG 24 hr tablet TK 1 T PO QD 11/27/17   [facility], Sunday Spillers,  PA-C  cetirizine (ZYRTEC ALLERGY) 10 MG tablet Take 1 tablet (10 mg total) by mouth daily. 11/27/17   [facility], Sunday Spillers, PA-C  citalopram (CELEXA) 10 MG tablet TK 1 T PO QD 11/27/17   [facility], Sunday Spillers, PA-C  clonazePAM (KLONOPIN) 1 MG tablet Take 1 mg by mouth 2 (two) times daily.    [provider]  doxycycline (VIBRAMYCIN) 100 MG capsule Take 1 capsule (100 mg total) by mouth 2 (two) times daily. 01/19/18   Trejon Duford, Tressia Miners A, NP  fluticasone (FLONASE) 50 MCG/ACT nasal spray Place 2 sprays into both nostrils daily. 11/27/17   [facility]riguez-[facility], Sunday Spillers, PA-C  gabapentin (NEURONTIN) 300 MG capsule Take 1 capsule (300 mg total) by mouth 2 (two) times daily. 11/27/17   [facility], Sunday Spillers, PA-C  HYDROcodone-homatropine (HYCODAN) 5-1.5 MG/5ML syrup Take 5 mLs by mouth every 6 (six) hours as needed for cough. 01/19/18   Loura Halt A, NP  hydrOXYzine (ATARAX/VISTARIL) 25 MG tablet TK 2 qhs 11/27/17   [facility], Sunday Spillers, PA-C  rizatriptan (MAXALT) 10 MG tablet Take 1 tablet (10 mg total) by mouth as needed for migraine. May repeat in 2 hours if needed 11/27/17   [facility]riguez-[facility], Sunday Spillers, PA-C    Family History Family History  Problem Relation Age of Onset  . Cancer Maternal Grandmother   . Cancer Paternal Grandfather     Social History Social History  Tobacco Use  . Smoking status: Never Smoker  . Smokeless tobacco: Never Used  Substance Use Topics  . Alcohol use: Never    Frequency: Never  . Drug use: Never     Allergies   Augmentin [amoxicillin-pot clavulanate]; Azithromycin; and Peanut-containing drug products   Review of Systems Review of Systems   Physical Exam Triage Vital Signs ED Triage Vitals  Enc Vitals Group     BP 01/19/18 1818 (!) 112/59     Pulse Rate 01/19/18 1818 95     Resp 01/19/18 1818 18     Temp 01/19/18 1818 98.6 F (37 C)     Temp Source 01/19/18 1818 Oral     SpO2 01/19/18 1818 100 %     Weight  --      Height --      Head Circumference --      Peak Flow --      Pain Score 01/19/18 1814 10     Pain Loc --      Pain Edu? --      Excl. in [place]? --    No data found.  Updated Vital Signs BP (!) 112/59 (BP Location: Left Arm)   Pulse 95   Temp 98.6 F (37 C) (Oral)   Resp 18   SpO2 100%   Visual Acuity Right Eye Distance:   Left Eye Distance:   Bilateral Distance:    Right Eye Near:   Left Eye Near:    Bilateral Near:     Physical Exam Vitals signs and nursing note reviewed.  Constitutional:      Appearance: Normal appearance.  HENT:     Head: Normocephalic and atraumatic.     Right Ear: Tympanic membrane, ear canal and external ear normal.     Left Ear: Tympanic membrane, ear canal and external ear normal.     Nose: Congestion and rhinorrhea present.     Right Turbinates: Swollen.     Left Turbinates: Swollen.     Right Sinus: Maxillary sinus tenderness and frontal sinus tenderness present.     Left Sinus: Maxillary sinus tenderness and frontal sinus tenderness present.     Mouth/Throat:     Pharynx: Oropharynx is clear.  Eyes:     Conjunctiva/sclera: Conjunctivae normal.  Neck:     Musculoskeletal: Normal range of motion and neck supple.  Cardiovascular:     Rate and Rhythm: Normal rate and regular rhythm.     Heart sounds: Normal heart sounds.  Pulmonary:     Effort: Pulmonary effort is normal.     Breath sounds: Normal breath sounds.  Musculoskeletal: Normal range of motion.  Skin:    General: Skin is warm and dry.  Neurological:     Mental Status: She is alert.  Psychiatric:        Mood and Affect: Mood normal.      UC Treatments / Results  Labs (all labs ordered are listed, but only abnormal results are displayed) Labs Reviewed - No data to display  EKG None  Radiology No results found.  Procedures Procedures (including critical care time)  Medications Ordered in UC Medications - No data to display  Initial Impression /  Assessment and Plan / UC Course  I have reviewed the triage vital signs and the nursing notes.  Pertinent labs & imaging results that were available during my care of the patient were reviewed by me and considered in my medical decision making (see chart for details).  Acute bacterial sinusitis and lower respiratory tract infection  Will treat with doxycycline due to pt Augmentin allergy Continue the Flonase and zyrtec Tessalon for cough Hycodan for worsening cough at night,  Follow up as needed for continued or worsening symptoms  Final Clinical Impressions(s) / UC Diagnoses   Final diagnoses:  Acute non-recurrent frontal sinusitis  Cough     Discharge Instructions     We will go ahead and treat you for a sinus infection today with doxycycline Continue the Flonase and Zyrtec daily I am also giving you something to help with the cough Tessalon pearls as needed for cough, these are non drowsy You can take the hycodan at nighttime for cough.  Follow up as needed for continued or worsening symptoms      ED Prescriptions    Medication Sig Dispense Auth. Provider   doxycycline (VIBRAMYCIN) 100 MG capsule Take 1 capsule (100 mg total) by mouth 2 (two) times daily. 20 capsule Dianely Krehbiel A, NP   benzonatate (TESSALON) 100 MG capsule Take 1 capsule (100 mg total) by mouth every 8 (eight) hours. 21 capsule Hamish Banks A, NP   HYDROcodone-homatropine (HYCODAN) 5-1.5 MG/5ML syrup Take 5 mLs by mouth every 6 (six) hours as needed for cough. 120 mL Loura Halt A, NP     Controlled Substance Prescriptions [place] Controlled Substance Registry consulted? no   Orvan July, NP 01/23/18 1649

## 2018-01-29 ENCOUNTER — Other Ambulatory Visit: Payer: Self-pay | Admitting: Internal Medicine

## 2018-01-29 DIAGNOSIS — M05771 Rheumatoid arthritis with rheumatoid factor of right ankle and foot without organ or systems involvement: Secondary | ICD-10-CM

## 2018-01-29 DIAGNOSIS — M05772 Rheumatoid arthritis with rheumatoid factor of left ankle and foot without organ or systems involvement: Principal | ICD-10-CM

## 2018-02-16 ENCOUNTER — Other Ambulatory Visit: Payer: Self-pay | Admitting: Internal Medicine

## 2018-02-16 DIAGNOSIS — F419 Anxiety disorder, unspecified: Secondary | ICD-10-CM

## 2018-02-16 DIAGNOSIS — G43909 Migraine, unspecified, not intractable, without status migrainosus: Secondary | ICD-10-CM

## 2018-02-17 NOTE — Telephone Encounter (Signed)
Gabapentin refill

## 2018-02-17 NOTE — Telephone Encounter (Signed)
Needs office visit before next refill

## 2018-03-11 ENCOUNTER — Other Ambulatory Visit: Payer: Self-pay

## 2018-03-11 DIAGNOSIS — J45909 Unspecified asthma, uncomplicated: Secondary | ICD-10-CM

## 2018-03-11 MED ORDER — ALBUTEROL SULFATE (SENSOR) 108 (90 BASE) MCG/ACT IN AEPB: 8.5000 g | INHALATION_SPRAY | RESPIRATORY_TRACT | 3 refills | Status: AC | PRN

## 2018-03-31 ENCOUNTER — Ambulatory Visit: Payer: BLUE CROSS/BLUE SHIELD | Admitting: Internal Medicine

## 2019-02-24 ENCOUNTER — Ambulatory Visit: Payer: Medicaid - Out of State | Attending: Internal Medicine

## 2019-02-24 DIAGNOSIS — Z20822 Contact with and (suspected) exposure to covid-19: Secondary | ICD-10-CM

## 2019-02-25 LAB — NOVEL CORONAVIRUS, NAA: SARS-CoV-2, NAA: NOT DETECTED

## 2019-04-09 ENCOUNTER — Ambulatory Visit: Payer: Self-pay

## 2019-04-16 ENCOUNTER — Ambulatory Visit: Payer: Self-pay

## 2019-09-14 IMAGING — DX DG CHEST 2V
2 series · 2 of 2 positions shown · non-contrast
Comparison: None.

CLINICAL DATA: Patient with chest discomfort for 3 weeks.

EXAM:
CHEST - 2 VIEW

[dg chest 2 view (1 of 2)]
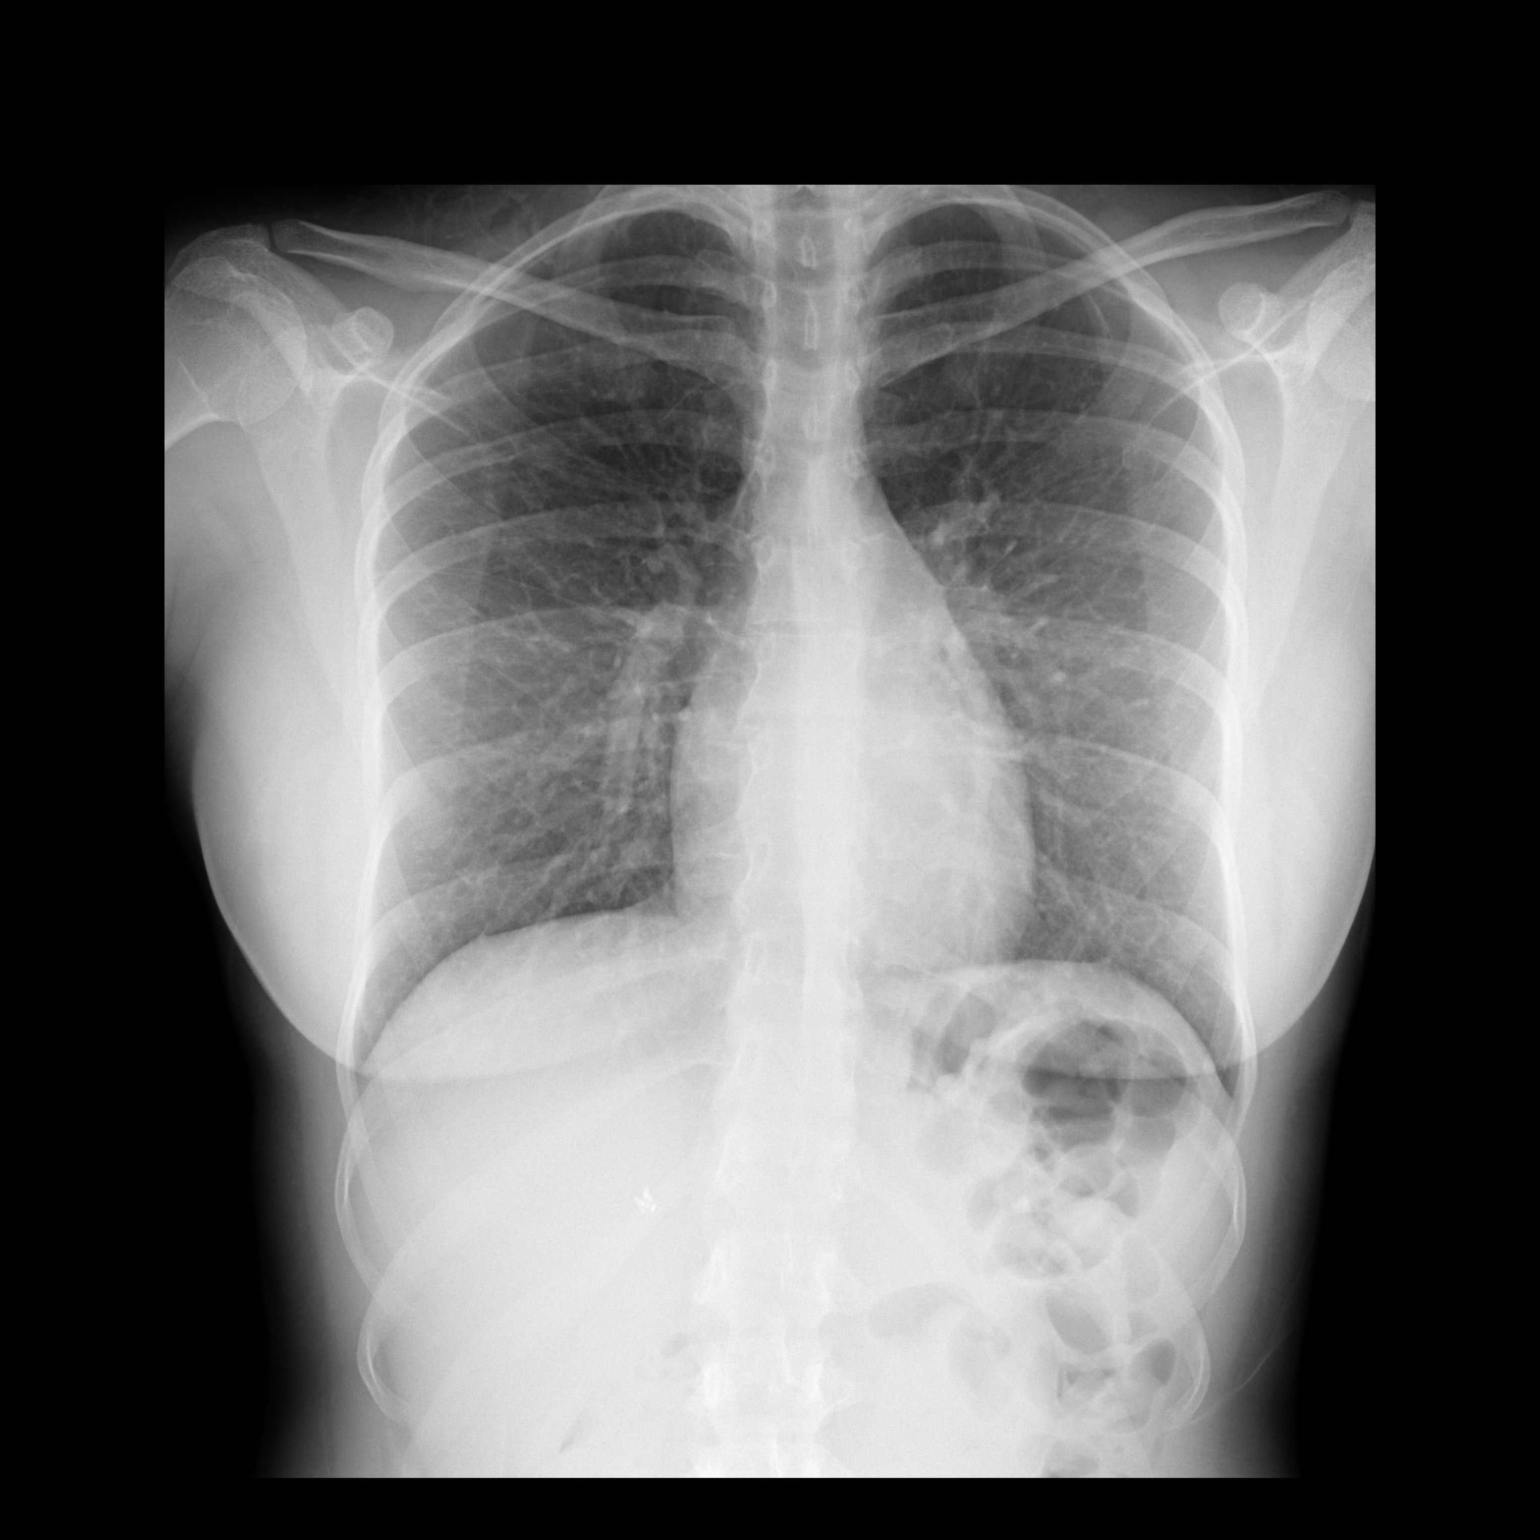

[dg chest 2 view (2 of 2)]
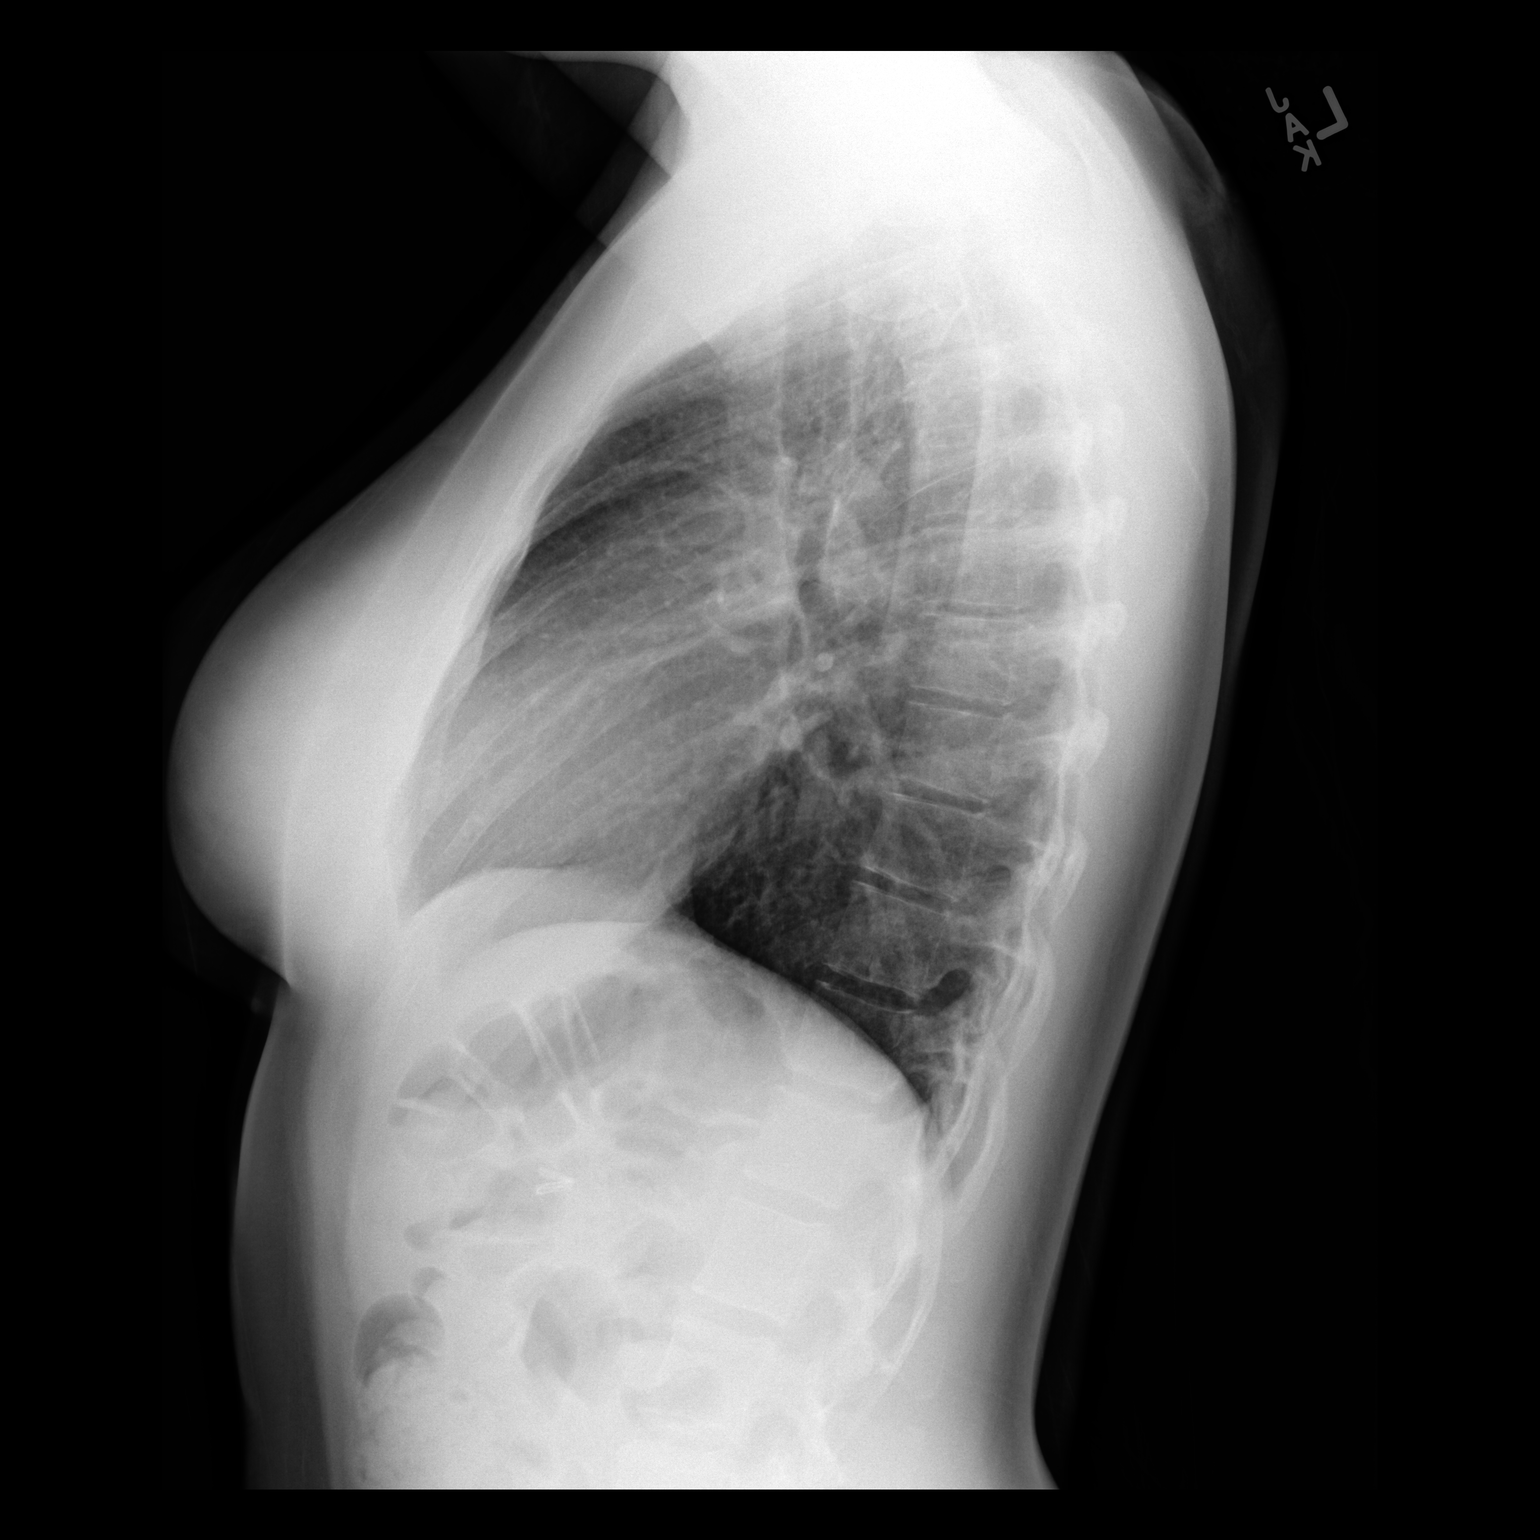

[2 of 2 positions shown; findings below may reference images not displayed]

FINDINGS: Normal cardiac and mediastinal contours. No consolidative pulmonary
opacities. No pleural effusion or pneumothorax. Regional skeleton is
unremarkable.
IMPRESSION: No acute cardiopulmonary process.

## 2022-01-26 DIAGNOSIS — M2550 Pain in unspecified joint: Secondary | ICD-10-CM | POA: Diagnosis not present

## 2022-01-26 DIAGNOSIS — Z20822 Contact with and (suspected) exposure to covid-19: Secondary | ICD-10-CM | POA: Diagnosis not present

## 2022-01-26 DIAGNOSIS — R509 Fever, unspecified: Secondary | ICD-10-CM | POA: Diagnosis not present

## 2022-01-30 DIAGNOSIS — M545 Low back pain, unspecified: Secondary | ICD-10-CM | POA: Diagnosis not present

## 2022-01-30 DIAGNOSIS — R7989 Other specified abnormal findings of blood chemistry: Secondary | ICD-10-CM | POA: Diagnosis not present

## 2022-01-30 DIAGNOSIS — G959 Disease of spinal cord, unspecified: Secondary | ICD-10-CM | POA: Diagnosis not present

## 2022-01-30 DIAGNOSIS — D75839 Thrombocytosis, unspecified: Secondary | ICD-10-CM | POA: Diagnosis not present

## 2022-02-20 DIAGNOSIS — R1033 Periumbilical pain: Secondary | ICD-10-CM | POA: Diagnosis not present

## 2022-02-20 DIAGNOSIS — N3000 Acute cystitis without hematuria: Secondary | ICD-10-CM | POA: Diagnosis not present

## 2022-02-20 DIAGNOSIS — R87619 Unspecified abnormal cytological findings in specimens from cervix uteri: Secondary | ICD-10-CM | POA: Diagnosis not present

## 2022-02-20 DIAGNOSIS — N3289 Other specified disorders of bladder: Secondary | ICD-10-CM | POA: Diagnosis not present

## 2022-02-21 DIAGNOSIS — N3289 Other specified disorders of bladder: Secondary | ICD-10-CM | POA: Diagnosis not present

## 2022-02-25 DIAGNOSIS — K529 Noninfective gastroenteritis and colitis, unspecified: Secondary | ICD-10-CM | POA: Diagnosis not present

## 2022-02-25 DIAGNOSIS — Z8742 Personal history of other diseases of the female genital tract: Secondary | ICD-10-CM | POA: Diagnosis not present

## 2022-02-25 DIAGNOSIS — R1033 Periumbilical pain: Secondary | ICD-10-CM | POA: Diagnosis not present

## 2022-02-25 DIAGNOSIS — Z8719 Personal history of other diseases of the digestive system: Secondary | ICD-10-CM | POA: Diagnosis not present

## 2022-03-01 DIAGNOSIS — R102 Pelvic and perineal pain: Secondary | ICD-10-CM | POA: Diagnosis not present

## 2022-03-01 DIAGNOSIS — N946 Dysmenorrhea, unspecified: Secondary | ICD-10-CM | POA: Diagnosis not present

## 2022-03-17 DIAGNOSIS — J019 Acute sinusitis, unspecified: Secondary | ICD-10-CM | POA: Diagnosis not present

## 2022-03-17 DIAGNOSIS — B9689 Other specified bacterial agents as the cause of diseases classified elsewhere: Secondary | ICD-10-CM | POA: Diagnosis not present

## 2022-04-18 DIAGNOSIS — R300 Dysuria: Secondary | ICD-10-CM | POA: Diagnosis not present

## 2022-04-18 DIAGNOSIS — Z113 Encounter for screening for infections with a predominantly sexual mode of transmission: Secondary | ICD-10-CM | POA: Diagnosis not present

## 2022-04-18 DIAGNOSIS — N760 Acute vaginitis: Secondary | ICD-10-CM | POA: Diagnosis not present

## 2022-04-18 DIAGNOSIS — N809 Endometriosis, unspecified: Secondary | ICD-10-CM | POA: Diagnosis not present

## 2022-04-18 DIAGNOSIS — N9489 Other specified conditions associated with female genital organs and menstrual cycle: Secondary | ICD-10-CM | POA: Diagnosis not present

## 2022-04-25 DIAGNOSIS — J0191 Acute recurrent sinusitis, unspecified: Secondary | ICD-10-CM | POA: Diagnosis not present

## 2022-06-11 DIAGNOSIS — T7840XA Allergy, unspecified, initial encounter: Secondary | ICD-10-CM | POA: Diagnosis not present

## 2022-06-13 DIAGNOSIS — K130 Diseases of lips: Secondary | ICD-10-CM | POA: Diagnosis not present

## 2022-07-01 DIAGNOSIS — T781XXA Other adverse food reactions, not elsewhere classified, initial encounter: Secondary | ICD-10-CM | POA: Diagnosis not present

## 2022-08-07 DIAGNOSIS — B9689 Other specified bacterial agents as the cause of diseases classified elsewhere: Secondary | ICD-10-CM | POA: Diagnosis not present

## 2022-08-07 DIAGNOSIS — J019 Acute sinusitis, unspecified: Secondary | ICD-10-CM | POA: Diagnosis not present

## 2022-08-07 DIAGNOSIS — G8929 Other chronic pain: Secondary | ICD-10-CM | POA: Diagnosis not present

## 2022-08-07 DIAGNOSIS — R102 Pelvic and perineal pain: Secondary | ICD-10-CM | POA: Diagnosis not present

## 2022-09-03 DIAGNOSIS — T148XXA Other injury of unspecified body region, initial encounter: Secondary | ICD-10-CM | POA: Diagnosis not present

## 2022-10-08 DIAGNOSIS — M069 Rheumatoid arthritis, unspecified: Secondary | ICD-10-CM | POA: Diagnosis not present

## 2022-10-08 DIAGNOSIS — J4540 Moderate persistent asthma, uncomplicated: Secondary | ICD-10-CM | POA: Diagnosis not present

## 2022-10-08 DIAGNOSIS — F419 Anxiety disorder, unspecified: Secondary | ICD-10-CM | POA: Diagnosis not present

## 2022-10-08 DIAGNOSIS — Z13220 Encounter for screening for lipoid disorders: Secondary | ICD-10-CM | POA: Diagnosis not present

## 2022-10-08 DIAGNOSIS — F330 Major depressive disorder, recurrent, mild: Secondary | ICD-10-CM | POA: Diagnosis not present

## 2022-10-08 DIAGNOSIS — L309 Dermatitis, unspecified: Secondary | ICD-10-CM | POA: Diagnosis not present

## 2022-10-08 DIAGNOSIS — Z131 Encounter for screening for diabetes mellitus: Secondary | ICD-10-CM | POA: Diagnosis not present

## 2022-10-08 DIAGNOSIS — D6800 Von Willebrand disease, unspecified: Secondary | ICD-10-CM | POA: Diagnosis not present

## 2022-10-08 DIAGNOSIS — D573 Sickle-cell trait: Secondary | ICD-10-CM | POA: Diagnosis not present

## 2022-10-08 DIAGNOSIS — Z8639 Personal history of other endocrine, nutritional and metabolic disease: Secondary | ICD-10-CM | POA: Diagnosis not present

## 2022-10-08 DIAGNOSIS — N809 Endometriosis, unspecified: Secondary | ICD-10-CM | POA: Diagnosis not present

## 2022-10-08 DIAGNOSIS — Z91010 Allergy to peanuts: Secondary | ICD-10-CM | POA: Diagnosis not present

## 2022-10-08 DIAGNOSIS — D219 Benign neoplasm of connective and other soft tissue, unspecified: Secondary | ICD-10-CM | POA: Diagnosis not present

## 2022-10-08 DIAGNOSIS — K5010 Crohn's disease of large intestine without complications: Secondary | ICD-10-CM | POA: Diagnosis not present

## 2022-10-08 DIAGNOSIS — G43109 Migraine with aura, not intractable, without status migrainosus: Secondary | ICD-10-CM | POA: Diagnosis not present

## 2022-10-21 DIAGNOSIS — G43909 Migraine, unspecified, not intractable, without status migrainosus: Secondary | ICD-10-CM | POA: Diagnosis not present

## 2022-10-21 DIAGNOSIS — D75839 Thrombocytosis, unspecified: Secondary | ICD-10-CM | POA: Diagnosis not present

## 2022-10-21 DIAGNOSIS — D571 Sickle-cell disease without crisis: Secondary | ICD-10-CM | POA: Diagnosis not present

## 2022-10-21 DIAGNOSIS — M545 Low back pain, unspecified: Secondary | ICD-10-CM | POA: Diagnosis not present

## 2022-10-21 DIAGNOSIS — R110 Nausea: Secondary | ICD-10-CM | POA: Diagnosis not present

## 2022-10-22 DIAGNOSIS — D573 Sickle-cell trait: Secondary | ICD-10-CM | POA: Diagnosis not present

## 2022-10-22 DIAGNOSIS — G43109 Migraine with aura, not intractable, without status migrainosus: Secondary | ICD-10-CM | POA: Diagnosis not present

## 2022-10-25 DIAGNOSIS — D573 Sickle-cell trait: Secondary | ICD-10-CM | POA: Diagnosis not present

## 2022-10-25 DIAGNOSIS — R079 Chest pain, unspecified: Secondary | ICD-10-CM | POA: Diagnosis not present

## 2022-10-25 DIAGNOSIS — R000 Tachycardia, unspecified: Secondary | ICD-10-CM | POA: Diagnosis not present

## 2022-10-25 DIAGNOSIS — M545 Low back pain, unspecified: Secondary | ICD-10-CM | POA: Diagnosis not present

## 2022-10-28 DIAGNOSIS — D573 Sickle-cell trait: Secondary | ICD-10-CM | POA: Diagnosis not present

## 2022-10-28 DIAGNOSIS — R52 Pain, unspecified: Secondary | ICD-10-CM | POA: Diagnosis not present

## 2022-10-30 DIAGNOSIS — F411 Generalized anxiety disorder: Secondary | ICD-10-CM | POA: Diagnosis not present

## 2022-11-04 DIAGNOSIS — G43711 Chronic migraine without aura, intractable, with status migrainosus: Secondary | ICD-10-CM | POA: Diagnosis not present

## 2022-11-04 DIAGNOSIS — K5010 Crohn's disease of large intestine without complications: Secondary | ICD-10-CM | POA: Diagnosis not present

## 2022-11-04 DIAGNOSIS — M069 Rheumatoid arthritis, unspecified: Secondary | ICD-10-CM | POA: Diagnosis not present

## 2022-11-04 DIAGNOSIS — M5481 Occipital neuralgia: Secondary | ICD-10-CM | POA: Diagnosis not present

## 2022-11-04 DIAGNOSIS — D573 Sickle-cell trait: Secondary | ICD-10-CM | POA: Diagnosis not present

## 2022-11-04 DIAGNOSIS — Z0000 Encounter for general adult medical examination without abnormal findings: Secondary | ICD-10-CM | POA: Diagnosis not present

## 2022-11-04 DIAGNOSIS — Z23 Encounter for immunization: Secondary | ICD-10-CM | POA: Diagnosis not present

## 2022-11-04 DIAGNOSIS — Z1159 Encounter for screening for other viral diseases: Secondary | ICD-10-CM | POA: Diagnosis not present

## 2022-11-04 DIAGNOSIS — M542 Cervicalgia: Secondary | ICD-10-CM | POA: Diagnosis not present

## 2022-11-04 DIAGNOSIS — G8929 Other chronic pain: Secondary | ICD-10-CM | POA: Diagnosis not present

## 2022-11-04 DIAGNOSIS — Z114 Encounter for screening for human immunodeficiency virus [HIV]: Secondary | ICD-10-CM | POA: Diagnosis not present

## 2022-11-04 DIAGNOSIS — G43119 Migraine with aura, intractable, without status migrainosus: Secondary | ICD-10-CM | POA: Diagnosis not present

## 2022-11-05 DIAGNOSIS — Z114 Encounter for screening for human immunodeficiency virus [HIV]: Secondary | ICD-10-CM | POA: Diagnosis not present

## 2022-11-05 DIAGNOSIS — F411 Generalized anxiety disorder: Secondary | ICD-10-CM | POA: Diagnosis not present

## 2022-11-05 DIAGNOSIS — Z0000 Encounter for general adult medical examination without abnormal findings: Secondary | ICD-10-CM | POA: Diagnosis not present

## 2022-11-06 DIAGNOSIS — D6800 Von Willebrand disease, unspecified: Secondary | ICD-10-CM | POA: Diagnosis not present

## 2022-11-06 DIAGNOSIS — D573 Sickle-cell trait: Secondary | ICD-10-CM | POA: Diagnosis not present

## 2022-11-08 DIAGNOSIS — M797 Fibromyalgia: Secondary | ICD-10-CM | POA: Diagnosis not present

## 2022-11-08 DIAGNOSIS — M2550 Pain in unspecified joint: Secondary | ICD-10-CM | POA: Diagnosis not present

## 2022-11-08 DIAGNOSIS — M79641 Pain in right hand: Secondary | ICD-10-CM | POA: Diagnosis not present

## 2022-11-08 DIAGNOSIS — G43109 Migraine with aura, not intractable, without status migrainosus: Secondary | ICD-10-CM | POA: Diagnosis not present

## 2022-11-08 DIAGNOSIS — F419 Anxiety disorder, unspecified: Secondary | ICD-10-CM | POA: Diagnosis not present

## 2022-11-08 DIAGNOSIS — M79642 Pain in left hand: Secondary | ICD-10-CM | POA: Diagnosis not present

## 2022-11-08 DIAGNOSIS — K5010 Crohn's disease of large intestine without complications: Secondary | ICD-10-CM | POA: Diagnosis not present

## 2022-11-08 DIAGNOSIS — M069 Rheumatoid arthritis, unspecified: Secondary | ICD-10-CM | POA: Diagnosis not present

## 2022-11-08 DIAGNOSIS — D573 Sickle-cell trait: Secondary | ICD-10-CM | POA: Diagnosis not present

## 2022-11-08 DIAGNOSIS — F331 Major depressive disorder, recurrent, moderate: Secondary | ICD-10-CM | POA: Diagnosis not present

## 2022-11-08 DIAGNOSIS — M79671 Pain in right foot: Secondary | ICD-10-CM | POA: Diagnosis not present

## 2022-11-08 DIAGNOSIS — M79672 Pain in left foot: Secondary | ICD-10-CM | POA: Diagnosis not present

## 2022-11-12 DIAGNOSIS — G8929 Other chronic pain: Secondary | ICD-10-CM | POA: Diagnosis not present

## 2022-11-12 DIAGNOSIS — K5010 Crohn's disease of large intestine without complications: Secondary | ICD-10-CM | POA: Diagnosis not present

## 2022-11-12 DIAGNOSIS — M0800 Unspecified juvenile rheumatoid arthritis of unspecified site: Secondary | ICD-10-CM | POA: Diagnosis not present

## 2022-11-12 DIAGNOSIS — M797 Fibromyalgia: Secondary | ICD-10-CM | POA: Diagnosis not present

## 2022-11-12 DIAGNOSIS — N809 Endometriosis, unspecified: Secondary | ICD-10-CM | POA: Diagnosis not present

## 2022-11-18 DIAGNOSIS — M797 Fibromyalgia: Secondary | ICD-10-CM | POA: Diagnosis not present

## 2022-11-18 DIAGNOSIS — Z9109 Other allergy status, other than to drugs and biological substances: Secondary | ICD-10-CM | POA: Diagnosis not present

## 2022-11-18 DIAGNOSIS — K5010 Crohn's disease of large intestine without complications: Secondary | ICD-10-CM | POA: Diagnosis not present

## 2022-11-18 DIAGNOSIS — G43109 Migraine with aura, not intractable, without status migrainosus: Secondary | ICD-10-CM | POA: Diagnosis not present

## 2022-11-18 DIAGNOSIS — G4700 Insomnia, unspecified: Secondary | ICD-10-CM | POA: Diagnosis not present

## 2022-11-18 DIAGNOSIS — D573 Sickle-cell trait: Secondary | ICD-10-CM | POA: Diagnosis not present

## 2022-11-18 DIAGNOSIS — Z91018 Allergy to other foods: Secondary | ICD-10-CM | POA: Diagnosis not present

## 2022-11-18 DIAGNOSIS — F411 Generalized anxiety disorder: Secondary | ICD-10-CM | POA: Diagnosis not present

## 2022-11-18 DIAGNOSIS — R110 Nausea: Secondary | ICD-10-CM | POA: Diagnosis not present

## 2022-11-20 DIAGNOSIS — G4719 Other hypersomnia: Secondary | ICD-10-CM | POA: Diagnosis not present

## 2022-11-20 DIAGNOSIS — R0683 Snoring: Secondary | ICD-10-CM | POA: Diagnosis not present

## 2022-11-20 DIAGNOSIS — G4701 Insomnia due to medical condition: Secondary | ICD-10-CM | POA: Diagnosis not present

## 2022-11-25 DIAGNOSIS — M5481 Occipital neuralgia: Secondary | ICD-10-CM | POA: Diagnosis not present

## 2022-11-25 DIAGNOSIS — G4700 Insomnia, unspecified: Secondary | ICD-10-CM | POA: Diagnosis not present

## 2022-11-25 DIAGNOSIS — G43109 Migraine with aura, not intractable, without status migrainosus: Secondary | ICD-10-CM | POA: Diagnosis not present

## 2022-11-25 DIAGNOSIS — K5010 Crohn's disease of large intestine without complications: Secondary | ICD-10-CM | POA: Diagnosis not present

## 2022-11-25 DIAGNOSIS — M62838 Other muscle spasm: Secondary | ICD-10-CM | POA: Diagnosis not present

## 2022-11-25 DIAGNOSIS — R110 Nausea: Secondary | ICD-10-CM | POA: Diagnosis not present

## 2022-11-25 DIAGNOSIS — F419 Anxiety disorder, unspecified: Secondary | ICD-10-CM | POA: Diagnosis not present

## 2022-11-25 DIAGNOSIS — Z9109 Other allergy status, other than to drugs and biological substances: Secondary | ICD-10-CM | POA: Diagnosis not present

## 2022-11-25 DIAGNOSIS — G8929 Other chronic pain: Secondary | ICD-10-CM | POA: Diagnosis not present

## 2022-11-25 DIAGNOSIS — F411 Generalized anxiety disorder: Secondary | ICD-10-CM | POA: Diagnosis not present

## 2022-11-27 DIAGNOSIS — M25542 Pain in joints of left hand: Secondary | ICD-10-CM | POA: Diagnosis not present

## 2022-11-27 DIAGNOSIS — M25541 Pain in joints of right hand: Secondary | ICD-10-CM | POA: Diagnosis not present

## 2022-11-27 DIAGNOSIS — M25572 Pain in left ankle and joints of left foot: Secondary | ICD-10-CM | POA: Diagnosis not present

## 2022-11-27 DIAGNOSIS — F339 Major depressive disorder, recurrent, unspecified: Secondary | ICD-10-CM | POA: Diagnosis not present

## 2022-11-27 DIAGNOSIS — M25571 Pain in right ankle and joints of right foot: Secondary | ICD-10-CM | POA: Diagnosis not present

## 2022-12-02 DIAGNOSIS — Z8719 Personal history of other diseases of the digestive system: Secondary | ICD-10-CM | POA: Diagnosis not present

## 2022-12-02 DIAGNOSIS — F411 Generalized anxiety disorder: Secondary | ICD-10-CM | POA: Diagnosis not present

## 2022-12-02 DIAGNOSIS — K219 Gastro-esophageal reflux disease without esophagitis: Secondary | ICD-10-CM | POA: Diagnosis not present

## 2022-12-02 DIAGNOSIS — R112 Nausea with vomiting, unspecified: Secondary | ICD-10-CM | POA: Diagnosis not present

## 2022-12-02 DIAGNOSIS — K5010 Crohn's disease of large intestine without complications: Secondary | ICD-10-CM | POA: Diagnosis not present

## 2022-12-02 DIAGNOSIS — R140 Abdominal distension (gaseous): Secondary | ICD-10-CM | POA: Diagnosis not present

## 2022-12-02 DIAGNOSIS — R1084 Generalized abdominal pain: Secondary | ICD-10-CM | POA: Diagnosis not present

## 2022-12-09 DIAGNOSIS — F411 Generalized anxiety disorder: Secondary | ICD-10-CM | POA: Diagnosis not present

## 2022-12-12 DIAGNOSIS — I959 Hypotension, unspecified: Secondary | ICD-10-CM | POA: Diagnosis not present

## 2022-12-12 DIAGNOSIS — D6800 Von Willebrand disease, unspecified: Secondary | ICD-10-CM | POA: Diagnosis not present

## 2022-12-12 DIAGNOSIS — K5010 Crohn's disease of large intestine without complications: Secondary | ICD-10-CM | POA: Diagnosis not present

## 2022-12-12 DIAGNOSIS — M797 Fibromyalgia: Secondary | ICD-10-CM | POA: Diagnosis not present

## 2022-12-12 DIAGNOSIS — M069 Rheumatoid arthritis, unspecified: Secondary | ICD-10-CM | POA: Diagnosis not present

## 2022-12-12 DIAGNOSIS — D573 Sickle-cell trait: Secondary | ICD-10-CM | POA: Diagnosis not present

## 2022-12-12 DIAGNOSIS — G43109 Migraine with aura, not intractable, without status migrainosus: Secondary | ICD-10-CM | POA: Diagnosis not present

## 2022-12-17 DIAGNOSIS — M797 Fibromyalgia: Secondary | ICD-10-CM | POA: Diagnosis not present

## 2022-12-19 DIAGNOSIS — K5010 Crohn's disease of large intestine without complications: Secondary | ICD-10-CM | POA: Diagnosis not present

## 2022-12-19 DIAGNOSIS — F331 Major depressive disorder, recurrent, moderate: Secondary | ICD-10-CM | POA: Diagnosis not present

## 2022-12-19 DIAGNOSIS — G4700 Insomnia, unspecified: Secondary | ICD-10-CM | POA: Diagnosis not present

## 2022-12-19 DIAGNOSIS — D573 Sickle-cell trait: Secondary | ICD-10-CM | POA: Diagnosis not present

## 2022-12-19 DIAGNOSIS — G43109 Migraine with aura, not intractable, without status migrainosus: Secondary | ICD-10-CM | POA: Diagnosis not present

## 2022-12-19 DIAGNOSIS — F419 Anxiety disorder, unspecified: Secondary | ICD-10-CM | POA: Diagnosis not present

## 2022-12-19 DIAGNOSIS — D6800 Von Willebrand disease, unspecified: Secondary | ICD-10-CM | POA: Diagnosis not present

## 2022-12-19 DIAGNOSIS — M797 Fibromyalgia: Secondary | ICD-10-CM | POA: Diagnosis not present

## 2022-12-19 DIAGNOSIS — F411 Generalized anxiety disorder: Secondary | ICD-10-CM | POA: Diagnosis not present

## 2022-12-20 DIAGNOSIS — F411 Generalized anxiety disorder: Secondary | ICD-10-CM | POA: Diagnosis not present

## 2022-12-23 DIAGNOSIS — F411 Generalized anxiety disorder: Secondary | ICD-10-CM | POA: Diagnosis not present

## 2022-12-25 DIAGNOSIS — R102 Pelvic and perineal pain: Secondary | ICD-10-CM | POA: Diagnosis not present

## 2022-12-25 DIAGNOSIS — G8929 Other chronic pain: Secondary | ICD-10-CM | POA: Diagnosis not present

## 2022-12-30 DIAGNOSIS — F411 Generalized anxiety disorder: Secondary | ICD-10-CM | POA: Diagnosis not present

## 2023-01-01 DIAGNOSIS — R0683 Snoring: Secondary | ICD-10-CM | POA: Diagnosis not present

## 2023-01-01 DIAGNOSIS — G8929 Other chronic pain: Secondary | ICD-10-CM | POA: Diagnosis not present

## 2023-01-01 DIAGNOSIS — M5481 Occipital neuralgia: Secondary | ICD-10-CM | POA: Diagnosis not present

## 2023-01-01 DIAGNOSIS — M62838 Other muscle spasm: Secondary | ICD-10-CM | POA: Diagnosis not present

## 2023-01-01 DIAGNOSIS — R790 Abnormal level of blood mineral: Secondary | ICD-10-CM | POA: Diagnosis not present

## 2023-01-01 DIAGNOSIS — D573 Sickle-cell trait: Secondary | ICD-10-CM | POA: Diagnosis not present

## 2023-01-01 DIAGNOSIS — G43109 Migraine with aura, not intractable, without status migrainosus: Secondary | ICD-10-CM | POA: Diagnosis not present

## 2023-01-01 DIAGNOSIS — G4701 Insomnia due to medical condition: Secondary | ICD-10-CM | POA: Diagnosis not present

## 2023-01-06 DIAGNOSIS — F411 Generalized anxiety disorder: Secondary | ICD-10-CM | POA: Diagnosis not present

## 2023-01-09 DIAGNOSIS — M797 Fibromyalgia: Secondary | ICD-10-CM | POA: Diagnosis not present

## 2023-01-09 DIAGNOSIS — M0800 Unspecified juvenile rheumatoid arthritis of unspecified site: Secondary | ICD-10-CM | POA: Diagnosis not present

## 2023-01-09 DIAGNOSIS — K5010 Crohn's disease of large intestine without complications: Secondary | ICD-10-CM | POA: Diagnosis not present

## 2023-01-09 DIAGNOSIS — Z79891 Long term (current) use of opiate analgesic: Secondary | ICD-10-CM | POA: Diagnosis not present

## 2023-01-09 DIAGNOSIS — D573 Sickle-cell trait: Secondary | ICD-10-CM | POA: Diagnosis not present

## 2023-01-09 DIAGNOSIS — N809 Endometriosis, unspecified: Secondary | ICD-10-CM | POA: Diagnosis not present

## 2023-01-09 DIAGNOSIS — G8929 Other chronic pain: Secondary | ICD-10-CM | POA: Diagnosis not present

## 2023-01-11 DIAGNOSIS — G479 Sleep disorder, unspecified: Secondary | ICD-10-CM | POA: Diagnosis not present

## 2023-01-13 DIAGNOSIS — K5010 Crohn's disease of large intestine without complications: Secondary | ICD-10-CM | POA: Diagnosis not present

## 2023-01-13 DIAGNOSIS — G43109 Migraine with aura, not intractable, without status migrainosus: Secondary | ICD-10-CM | POA: Diagnosis not present

## 2023-01-13 DIAGNOSIS — D573 Sickle-cell trait: Secondary | ICD-10-CM | POA: Diagnosis not present

## 2023-01-13 DIAGNOSIS — R000 Tachycardia, unspecified: Secondary | ICD-10-CM | POA: Diagnosis not present

## 2023-01-13 DIAGNOSIS — G4700 Insomnia, unspecified: Secondary | ICD-10-CM | POA: Diagnosis not present

## 2023-01-13 DIAGNOSIS — D6800 Von Willebrand disease, unspecified: Secondary | ICD-10-CM | POA: Diagnosis not present

## 2023-01-13 DIAGNOSIS — F331 Major depressive disorder, recurrent, moderate: Secondary | ICD-10-CM | POA: Diagnosis not present

## 2023-01-13 DIAGNOSIS — F419 Anxiety disorder, unspecified: Secondary | ICD-10-CM | POA: Diagnosis not present

## 2023-01-13 DIAGNOSIS — M797 Fibromyalgia: Secondary | ICD-10-CM | POA: Diagnosis not present

## 2023-01-14 DIAGNOSIS — J069 Acute upper respiratory infection, unspecified: Secondary | ICD-10-CM | POA: Diagnosis not present

## 2023-01-14 DIAGNOSIS — F411 Generalized anxiety disorder: Secondary | ICD-10-CM | POA: Diagnosis not present

## 2023-01-14 DIAGNOSIS — J019 Acute sinusitis, unspecified: Secondary | ICD-10-CM | POA: Diagnosis not present

## 2023-01-16 DIAGNOSIS — S8391XA Sprain of unspecified site of right knee, initial encounter: Secondary | ICD-10-CM | POA: Diagnosis not present

## 2023-01-17 DIAGNOSIS — F339 Major depressive disorder, recurrent, unspecified: Secondary | ICD-10-CM | POA: Diagnosis not present

## 2023-01-26 DIAGNOSIS — R102 Pelvic and perineal pain: Secondary | ICD-10-CM | POA: Diagnosis not present

## 2023-01-26 DIAGNOSIS — G8929 Other chronic pain: Secondary | ICD-10-CM | POA: Diagnosis not present

## 2023-01-27 DIAGNOSIS — F411 Generalized anxiety disorder: Secondary | ICD-10-CM | POA: Diagnosis not present

## 2023-01-28 DIAGNOSIS — M25561 Pain in right knee: Secondary | ICD-10-CM | POA: Diagnosis not present

## 2023-01-28 DIAGNOSIS — M25461 Effusion, right knee: Secondary | ICD-10-CM | POA: Diagnosis not present

## 2023-01-31 DIAGNOSIS — M25561 Pain in right knee: Secondary | ICD-10-CM | POA: Diagnosis not present

## 2023-01-31 DIAGNOSIS — R600 Localized edema: Secondary | ICD-10-CM | POA: Diagnosis not present

## 2023-01-31 DIAGNOSIS — F4310 Post-traumatic stress disorder, unspecified: Secondary | ICD-10-CM | POA: Diagnosis not present

## 2023-02-06 DIAGNOSIS — F411 Generalized anxiety disorder: Secondary | ICD-10-CM | POA: Diagnosis not present

## 2023-02-10 DIAGNOSIS — F411 Generalized anxiety disorder: Secondary | ICD-10-CM | POA: Diagnosis not present

## 2023-02-12 DIAGNOSIS — R0981 Nasal congestion: Secondary | ICD-10-CM | POA: Diagnosis not present

## 2023-02-14 DIAGNOSIS — R0981 Nasal congestion: Secondary | ICD-10-CM | POA: Diagnosis not present

## 2023-02-14 DIAGNOSIS — R051 Acute cough: Secondary | ICD-10-CM | POA: Diagnosis not present

## 2023-02-14 DIAGNOSIS — M791 Myalgia, unspecified site: Secondary | ICD-10-CM | POA: Diagnosis not present

## 2023-02-14 DIAGNOSIS — R070 Pain in throat: Secondary | ICD-10-CM | POA: Diagnosis not present

## 2023-02-17 DIAGNOSIS — F411 Generalized anxiety disorder: Secondary | ICD-10-CM | POA: Diagnosis not present

## 2023-02-21 DIAGNOSIS — R059 Cough, unspecified: Secondary | ICD-10-CM | POA: Diagnosis not present

## 2023-02-21 DIAGNOSIS — Z20822 Contact with and (suspected) exposure to covid-19: Secondary | ICD-10-CM | POA: Diagnosis not present

## 2023-02-21 DIAGNOSIS — J019 Acute sinusitis, unspecified: Secondary | ICD-10-CM | POA: Diagnosis not present

## 2023-02-25 DIAGNOSIS — M25561 Pain in right knee: Secondary | ICD-10-CM | POA: Diagnosis not present

## 2023-02-25 DIAGNOSIS — M25361 Other instability, right knee: Secondary | ICD-10-CM | POA: Diagnosis not present

## 2023-02-27 DIAGNOSIS — Z8669 Personal history of other diseases of the nervous system and sense organs: Secondary | ICD-10-CM | POA: Diagnosis not present

## 2023-02-27 DIAGNOSIS — D6800 Von Willebrand disease, unspecified: Secondary | ICD-10-CM | POA: Diagnosis not present

## 2023-02-27 DIAGNOSIS — N809 Endometriosis, unspecified: Secondary | ICD-10-CM | POA: Diagnosis not present

## 2023-02-27 DIAGNOSIS — F411 Generalized anxiety disorder: Secondary | ICD-10-CM | POA: Diagnosis not present

## 2023-02-27 DIAGNOSIS — Z13220 Encounter for screening for lipoid disorders: Secondary | ICD-10-CM | POA: Diagnosis not present

## 2023-02-27 DIAGNOSIS — D573 Sickle-cell trait: Secondary | ICD-10-CM | POA: Diagnosis not present

## 2023-02-27 DIAGNOSIS — Z8639 Personal history of other endocrine, nutritional and metabolic disease: Secondary | ICD-10-CM | POA: Diagnosis not present

## 2023-02-28 DIAGNOSIS — D573 Sickle-cell trait: Secondary | ICD-10-CM | POA: Diagnosis not present

## 2023-02-28 DIAGNOSIS — D6800 Von Willebrand disease, unspecified: Secondary | ICD-10-CM | POA: Diagnosis not present

## 2023-02-28 DIAGNOSIS — Z789 Other specified health status: Secondary | ICD-10-CM | POA: Diagnosis not present

## 2023-02-28 DIAGNOSIS — G8929 Other chronic pain: Secondary | ICD-10-CM | POA: Diagnosis not present

## 2023-02-28 DIAGNOSIS — G4700 Insomnia, unspecified: Secondary | ICD-10-CM | POA: Diagnosis not present

## 2023-02-28 DIAGNOSIS — M797 Fibromyalgia: Secondary | ICD-10-CM | POA: Diagnosis not present

## 2023-02-28 DIAGNOSIS — N760 Acute vaginitis: Secondary | ICD-10-CM | POA: Diagnosis not present

## 2023-02-28 DIAGNOSIS — R000 Tachycardia, unspecified: Secondary | ICD-10-CM | POA: Diagnosis not present

## 2023-02-28 DIAGNOSIS — R300 Dysuria: Secondary | ICD-10-CM | POA: Diagnosis not present

## 2023-02-28 DIAGNOSIS — L309 Dermatitis, unspecified: Secondary | ICD-10-CM | POA: Diagnosis not present

## 2023-03-04 DIAGNOSIS — R112 Nausea with vomiting, unspecified: Secondary | ICD-10-CM | POA: Diagnosis not present

## 2023-03-04 DIAGNOSIS — K5010 Crohn's disease of large intestine without complications: Secondary | ICD-10-CM | POA: Diagnosis not present

## 2023-03-04 DIAGNOSIS — R140 Abdominal distension (gaseous): Secondary | ICD-10-CM | POA: Diagnosis not present

## 2023-03-04 DIAGNOSIS — R1084 Generalized abdominal pain: Secondary | ICD-10-CM | POA: Diagnosis not present

## 2023-03-05 DIAGNOSIS — M7989 Other specified soft tissue disorders: Secondary | ICD-10-CM | POA: Diagnosis not present

## 2023-03-05 DIAGNOSIS — M25561 Pain in right knee: Secondary | ICD-10-CM | POA: Diagnosis not present

## 2023-03-05 DIAGNOSIS — M25661 Stiffness of right knee, not elsewhere classified: Secondary | ICD-10-CM | POA: Diagnosis not present

## 2023-03-05 DIAGNOSIS — F411 Generalized anxiety disorder: Secondary | ICD-10-CM | POA: Diagnosis not present

## 2023-03-05 DIAGNOSIS — R2689 Other abnormalities of gait and mobility: Secondary | ICD-10-CM | POA: Diagnosis not present

## 2023-03-05 DIAGNOSIS — M6281 Muscle weakness (generalized): Secondary | ICD-10-CM | POA: Diagnosis not present

## 2023-03-06 DIAGNOSIS — M5481 Occipital neuralgia: Secondary | ICD-10-CM | POA: Diagnosis not present

## 2023-03-06 DIAGNOSIS — G8929 Other chronic pain: Secondary | ICD-10-CM | POA: Diagnosis not present

## 2023-03-06 DIAGNOSIS — G43109 Migraine with aura, not intractable, without status migrainosus: Secondary | ICD-10-CM | POA: Diagnosis not present

## 2023-03-07 DIAGNOSIS — L2089 Other atopic dermatitis: Secondary | ICD-10-CM | POA: Diagnosis not present

## 2023-03-07 DIAGNOSIS — L439 Lichen planus, unspecified: Secondary | ICD-10-CM | POA: Diagnosis not present

## 2023-03-07 DIAGNOSIS — R21 Rash and other nonspecific skin eruption: Secondary | ICD-10-CM | POA: Diagnosis not present

## 2023-03-11 DIAGNOSIS — F411 Generalized anxiety disorder: Secondary | ICD-10-CM | POA: Diagnosis not present

## 2023-03-12 DIAGNOSIS — G4719 Other hypersomnia: Secondary | ICD-10-CM | POA: Diagnosis not present

## 2023-03-12 DIAGNOSIS — G4700 Insomnia, unspecified: Secondary | ICD-10-CM | POA: Diagnosis not present

## 2023-03-12 DIAGNOSIS — G4701 Insomnia due to medical condition: Secondary | ICD-10-CM | POA: Diagnosis not present

## 2023-03-13 DIAGNOSIS — G4700 Insomnia, unspecified: Secondary | ICD-10-CM | POA: Diagnosis not present

## 2023-03-17 DIAGNOSIS — F411 Generalized anxiety disorder: Secondary | ICD-10-CM | POA: Diagnosis not present

## 2023-03-18 DIAGNOSIS — M7918 Myalgia, other site: Secondary | ICD-10-CM | POA: Diagnosis not present

## 2023-03-19 DIAGNOSIS — F411 Generalized anxiety disorder: Secondary | ICD-10-CM | POA: Diagnosis not present

## 2023-03-27 DIAGNOSIS — F411 Generalized anxiety disorder: Secondary | ICD-10-CM | POA: Diagnosis not present

## 2023-04-03 DIAGNOSIS — L600 Ingrowing nail: Secondary | ICD-10-CM | POA: Diagnosis not present

## 2023-04-03 DIAGNOSIS — F411 Generalized anxiety disorder: Secondary | ICD-10-CM | POA: Diagnosis not present

## 2023-04-08 DIAGNOSIS — L600 Ingrowing nail: Secondary | ICD-10-CM | POA: Diagnosis not present

## 2023-04-11 DIAGNOSIS — L600 Ingrowing nail: Secondary | ICD-10-CM | POA: Diagnosis not present

## 2023-04-11 DIAGNOSIS — Z79899 Other long term (current) drug therapy: Secondary | ICD-10-CM | POA: Diagnosis not present

## 2023-04-11 DIAGNOSIS — G4701 Insomnia due to medical condition: Secondary | ICD-10-CM | POA: Diagnosis not present

## 2023-04-14 DIAGNOSIS — L600 Ingrowing nail: Secondary | ICD-10-CM | POA: Diagnosis not present

## 2023-04-14 DIAGNOSIS — G4700 Insomnia, unspecified: Secondary | ICD-10-CM | POA: Diagnosis not present

## 2023-04-14 DIAGNOSIS — F411 Generalized anxiety disorder: Secondary | ICD-10-CM | POA: Diagnosis not present

## 2023-04-14 DIAGNOSIS — R000 Tachycardia, unspecified: Secondary | ICD-10-CM | POA: Diagnosis not present

## 2023-04-15 DIAGNOSIS — L600 Ingrowing nail: Secondary | ICD-10-CM | POA: Diagnosis not present

## 2023-04-17 DIAGNOSIS — R000 Tachycardia, unspecified: Secondary | ICD-10-CM | POA: Diagnosis not present

## 2023-04-29 DIAGNOSIS — L600 Ingrowing nail: Secondary | ICD-10-CM | POA: Diagnosis not present

## 2023-05-12 DIAGNOSIS — F411 Generalized anxiety disorder: Secondary | ICD-10-CM | POA: Diagnosis not present

## 2023-05-13 DIAGNOSIS — L02611 Cutaneous abscess of right foot: Secondary | ICD-10-CM | POA: Diagnosis not present

## 2023-05-13 DIAGNOSIS — L600 Ingrowing nail: Secondary | ICD-10-CM | POA: Diagnosis not present

## 2023-05-15 DIAGNOSIS — L600 Ingrowing nail: Secondary | ICD-10-CM | POA: Diagnosis not present

## 2023-05-20 DIAGNOSIS — M5481 Occipital neuralgia: Secondary | ICD-10-CM | POA: Diagnosis not present

## 2023-05-21 DIAGNOSIS — L600 Ingrowing nail: Secondary | ICD-10-CM | POA: Diagnosis not present

## 2023-06-03 DIAGNOSIS — L439 Lichen planus, unspecified: Secondary | ICD-10-CM | POA: Diagnosis not present

## 2023-06-03 DIAGNOSIS — L2089 Other atopic dermatitis: Secondary | ICD-10-CM | POA: Diagnosis not present

## 2023-06-09 DIAGNOSIS — F411 Generalized anxiety disorder: Secondary | ICD-10-CM | POA: Diagnosis not present

## 2023-06-12 DIAGNOSIS — R000 Tachycardia, unspecified: Secondary | ICD-10-CM | POA: Diagnosis not present

## 2023-06-24 DIAGNOSIS — F411 Generalized anxiety disorder: Secondary | ICD-10-CM | POA: Diagnosis not present

## 2023-07-02 DIAGNOSIS — M7918 Myalgia, other site: Secondary | ICD-10-CM | POA: Diagnosis not present

## 2023-07-02 DIAGNOSIS — Z79899 Other long term (current) drug therapy: Secondary | ICD-10-CM | POA: Diagnosis not present

## 2023-07-25 DIAGNOSIS — M79672 Pain in left foot: Secondary | ICD-10-CM | POA: Diagnosis not present

## 2023-07-25 DIAGNOSIS — M7989 Other specified soft tissue disorders: Secondary | ICD-10-CM | POA: Diagnosis not present

## 2023-07-25 DIAGNOSIS — M79671 Pain in right foot: Secondary | ICD-10-CM | POA: Diagnosis not present

## 2023-08-06 DIAGNOSIS — M0800 Unspecified juvenile rheumatoid arthritis of unspecified site: Secondary | ICD-10-CM | POA: Diagnosis not present

## 2023-08-06 DIAGNOSIS — M5481 Occipital neuralgia: Secondary | ICD-10-CM | POA: Diagnosis not present

## 2023-08-06 DIAGNOSIS — G894 Chronic pain syndrome: Secondary | ICD-10-CM | POA: Diagnosis not present

## 2023-08-06 DIAGNOSIS — M797 Fibromyalgia: Secondary | ICD-10-CM | POA: Diagnosis not present

## 2023-08-06 DIAGNOSIS — Z79891 Long term (current) use of opiate analgesic: Secondary | ICD-10-CM | POA: Diagnosis not present

## 2023-08-13 DIAGNOSIS — R110 Nausea: Secondary | ICD-10-CM | POA: Diagnosis not present

## 2023-08-13 DIAGNOSIS — M7989 Other specified soft tissue disorders: Secondary | ICD-10-CM | POA: Diagnosis not present

## 2023-08-13 DIAGNOSIS — M797 Fibromyalgia: Secondary | ICD-10-CM | POA: Diagnosis not present

## 2023-08-13 DIAGNOSIS — R000 Tachycardia, unspecified: Secondary | ICD-10-CM | POA: Diagnosis not present

## 2023-08-13 DIAGNOSIS — M2550 Pain in unspecified joint: Secondary | ICD-10-CM | POA: Diagnosis not present

## 2023-08-13 DIAGNOSIS — K649 Unspecified hemorrhoids: Secondary | ICD-10-CM | POA: Diagnosis not present

## 2023-08-13 DIAGNOSIS — G43109 Migraine with aura, not intractable, without status migrainosus: Secondary | ICD-10-CM | POA: Diagnosis not present

## 2023-08-20 DIAGNOSIS — R110 Nausea: Secondary | ICD-10-CM | POA: Diagnosis not present

## 2023-08-20 DIAGNOSIS — G43109 Migraine with aura, not intractable, without status migrainosus: Secondary | ICD-10-CM | POA: Diagnosis not present

## 2023-08-20 DIAGNOSIS — G8929 Other chronic pain: Secondary | ICD-10-CM | POA: Diagnosis not present

## 2023-08-20 DIAGNOSIS — M62838 Other muscle spasm: Secondary | ICD-10-CM | POA: Diagnosis not present

## 2023-08-20 DIAGNOSIS — M5481 Occipital neuralgia: Secondary | ICD-10-CM | POA: Diagnosis not present

## 2023-08-20 DIAGNOSIS — G4700 Insomnia, unspecified: Secondary | ICD-10-CM | POA: Diagnosis not present

## 2023-08-20 DIAGNOSIS — F419 Anxiety disorder, unspecified: Secondary | ICD-10-CM | POA: Diagnosis not present

## 2023-08-20 DIAGNOSIS — H532 Diplopia: Secondary | ICD-10-CM | POA: Diagnosis not present

## 2023-08-20 DIAGNOSIS — G43709 Chronic migraine without aura, not intractable, without status migrainosus: Secondary | ICD-10-CM | POA: Diagnosis not present

## 2023-08-22 DIAGNOSIS — R519 Headache, unspecified: Secondary | ICD-10-CM | POA: Diagnosis not present

## 2023-08-22 DIAGNOSIS — Z79899 Other long term (current) drug therapy: Secondary | ICD-10-CM | POA: Diagnosis not present

## 2023-08-22 DIAGNOSIS — Z0389 Encounter for observation for other suspected diseases and conditions ruled out: Secondary | ICD-10-CM | POA: Diagnosis not present

## 2023-09-11 DIAGNOSIS — K649 Unspecified hemorrhoids: Secondary | ICD-10-CM | POA: Diagnosis not present

## 2023-09-11 DIAGNOSIS — M797 Fibromyalgia: Secondary | ICD-10-CM | POA: Diagnosis not present

## 2023-09-11 DIAGNOSIS — M7989 Other specified soft tissue disorders: Secondary | ICD-10-CM | POA: Diagnosis not present

## 2023-09-11 DIAGNOSIS — R000 Tachycardia, unspecified: Secondary | ICD-10-CM | POA: Diagnosis not present

## 2023-09-11 DIAGNOSIS — G43109 Migraine with aura, not intractable, without status migrainosus: Secondary | ICD-10-CM | POA: Diagnosis not present

## 2023-09-15 DIAGNOSIS — M1990 Unspecified osteoarthritis, unspecified site: Secondary | ICD-10-CM | POA: Diagnosis not present

## 2023-09-15 DIAGNOSIS — Z79899 Other long term (current) drug therapy: Secondary | ICD-10-CM | POA: Diagnosis not present

## 2023-09-15 DIAGNOSIS — D84821 Immunodeficiency due to drugs: Secondary | ICD-10-CM | POA: Diagnosis not present

## 2023-09-16 DIAGNOSIS — R000 Tachycardia, unspecified: Secondary | ICD-10-CM | POA: Diagnosis not present

## 2023-09-22 DIAGNOSIS — R29818 Other symptoms and signs involving the nervous system: Secondary | ICD-10-CM | POA: Diagnosis not present

## 2023-09-22 DIAGNOSIS — G43101 Migraine with aura, not intractable, with status migrainosus: Secondary | ICD-10-CM | POA: Diagnosis not present

## 2023-09-24 DIAGNOSIS — R002 Palpitations: Secondary | ICD-10-CM | POA: Diagnosis not present

## 2023-09-24 DIAGNOSIS — R079 Chest pain, unspecified: Secondary | ICD-10-CM | POA: Diagnosis not present

## 2023-09-24 DIAGNOSIS — I4711 Inappropriate sinus tachycardia, so stated: Secondary | ICD-10-CM | POA: Diagnosis not present

## 2023-09-24 DIAGNOSIS — Z7689 Persons encountering health services in other specified circumstances: Secondary | ICD-10-CM | POA: Diagnosis not present

## 2023-10-02 DIAGNOSIS — D6800 Von Willebrand disease, unspecified: Secondary | ICD-10-CM | POA: Diagnosis not present

## 2023-10-02 DIAGNOSIS — N946 Dysmenorrhea, unspecified: Secondary | ICD-10-CM | POA: Diagnosis not present

## 2023-10-17 DIAGNOSIS — F411 Generalized anxiety disorder: Secondary | ICD-10-CM | POA: Diagnosis not present

## 2023-10-21 DIAGNOSIS — F331 Major depressive disorder, recurrent, moderate: Secondary | ICD-10-CM | POA: Diagnosis not present

## 2023-10-21 DIAGNOSIS — F411 Generalized anxiety disorder: Secondary | ICD-10-CM | POA: Diagnosis not present

## 2023-10-21 DIAGNOSIS — F4312 Post-traumatic stress disorder, chronic: Secondary | ICD-10-CM | POA: Diagnosis not present

## 2023-11-11 DIAGNOSIS — F4312 Post-traumatic stress disorder, chronic: Secondary | ICD-10-CM | POA: Diagnosis not present

## 2023-11-11 DIAGNOSIS — F411 Generalized anxiety disorder: Secondary | ICD-10-CM | POA: Diagnosis not present

## 2023-11-11 DIAGNOSIS — F331 Major depressive disorder, recurrent, moderate: Secondary | ICD-10-CM | POA: Diagnosis not present

## 2023-11-20 DIAGNOSIS — F411 Generalized anxiety disorder: Secondary | ICD-10-CM | POA: Diagnosis not present

## 2023-11-20 DIAGNOSIS — F4312 Post-traumatic stress disorder, chronic: Secondary | ICD-10-CM | POA: Diagnosis not present

## 2023-11-20 DIAGNOSIS — F331 Major depressive disorder, recurrent, moderate: Secondary | ICD-10-CM | POA: Diagnosis not present

## 2023-11-30 DIAGNOSIS — J019 Acute sinusitis, unspecified: Secondary | ICD-10-CM | POA: Diagnosis not present

## 2023-12-03 DIAGNOSIS — F411 Generalized anxiety disorder: Secondary | ICD-10-CM | POA: Diagnosis not present

## 2023-12-03 DIAGNOSIS — F4312 Post-traumatic stress disorder, chronic: Secondary | ICD-10-CM | POA: Diagnosis not present

## 2023-12-03 DIAGNOSIS — F331 Major depressive disorder, recurrent, moderate: Secondary | ICD-10-CM | POA: Diagnosis not present

## 2023-12-15 DIAGNOSIS — F331 Major depressive disorder, recurrent, moderate: Secondary | ICD-10-CM | POA: Diagnosis not present

## 2023-12-15 DIAGNOSIS — F4312 Post-traumatic stress disorder, chronic: Secondary | ICD-10-CM | POA: Diagnosis not present

## 2023-12-15 DIAGNOSIS — F411 Generalized anxiety disorder: Secondary | ICD-10-CM | POA: Diagnosis not present

## 2023-12-29 DIAGNOSIS — F4312 Post-traumatic stress disorder, chronic: Secondary | ICD-10-CM | POA: Diagnosis not present

## 2023-12-29 DIAGNOSIS — F331 Major depressive disorder, recurrent, moderate: Secondary | ICD-10-CM | POA: Diagnosis not present

## 2023-12-29 DIAGNOSIS — F411 Generalized anxiety disorder: Secondary | ICD-10-CM | POA: Diagnosis not present

## 2024-01-03 DIAGNOSIS — G8929 Other chronic pain: Secondary | ICD-10-CM | POA: Diagnosis not present

## 2024-01-03 DIAGNOSIS — J3489 Other specified disorders of nose and nasal sinuses: Secondary | ICD-10-CM | POA: Diagnosis not present

## 2024-01-03 DIAGNOSIS — D573 Sickle-cell trait: Secondary | ICD-10-CM | POA: Diagnosis not present

## 2024-01-03 DIAGNOSIS — H10029 Other mucopurulent conjunctivitis, unspecified eye: Secondary | ICD-10-CM | POA: Diagnosis not present

## 2024-01-05 DIAGNOSIS — F331 Major depressive disorder, recurrent, moderate: Secondary | ICD-10-CM | POA: Diagnosis not present

## 2024-01-05 DIAGNOSIS — F411 Generalized anxiety disorder: Secondary | ICD-10-CM | POA: Diagnosis not present

## 2024-01-05 DIAGNOSIS — F4312 Post-traumatic stress disorder, chronic: Secondary | ICD-10-CM | POA: Diagnosis not present

## 2024-01-09 DIAGNOSIS — G43709 Chronic migraine without aura, not intractable, without status migrainosus: Secondary | ICD-10-CM | POA: Diagnosis not present

## 2024-01-12 DIAGNOSIS — F411 Generalized anxiety disorder: Secondary | ICD-10-CM | POA: Diagnosis not present

## 2024-01-12 DIAGNOSIS — F331 Major depressive disorder, recurrent, moderate: Secondary | ICD-10-CM | POA: Diagnosis not present

## 2024-01-19 DIAGNOSIS — F411 Generalized anxiety disorder: Secondary | ICD-10-CM | POA: Diagnosis not present

## 2024-01-19 DIAGNOSIS — F331 Major depressive disorder, recurrent, moderate: Secondary | ICD-10-CM | POA: Diagnosis not present

## 2024-01-19 DIAGNOSIS — F4312 Post-traumatic stress disorder, chronic: Secondary | ICD-10-CM | POA: Diagnosis not present
